# Patient Record
Sex: Female | Born: 1945
Health system: Southern US, Community
[De-identification: ages and names within clinical notes are randomized; demographics above are authoritative.]

## PROBLEM LIST (undated history)

## (undated) DIAGNOSIS — G20A1 Parkinson's disease without dyskinesia, without mention of fluctuations: Secondary | ICD-10-CM

## (undated) DIAGNOSIS — R112 Nausea with vomiting, unspecified: Secondary | ICD-10-CM

## (undated) DIAGNOSIS — F329 Major depressive disorder, single episode, unspecified: Secondary | ICD-10-CM

## (undated) DIAGNOSIS — Z9889 Other specified postprocedural states: Secondary | ICD-10-CM

## (undated) DIAGNOSIS — R0600 Dyspnea, unspecified: Secondary | ICD-10-CM

## (undated) DIAGNOSIS — E119 Type 2 diabetes mellitus without complications: Secondary | ICD-10-CM

## (undated) DIAGNOSIS — F32A Depression, unspecified: Secondary | ICD-10-CM

## (undated) DIAGNOSIS — K760 Fatty (change of) liver, not elsewhere classified: Secondary | ICD-10-CM

## (undated) DIAGNOSIS — E7800 Pure hypercholesterolemia, unspecified: Secondary | ICD-10-CM

## (undated) DIAGNOSIS — T8859XA Other complications of anesthesia, initial encounter: Secondary | ICD-10-CM

## (undated) DIAGNOSIS — C801 Malignant (primary) neoplasm, unspecified: Secondary | ICD-10-CM

## (undated) DIAGNOSIS — I10 Essential (primary) hypertension: Secondary | ICD-10-CM

## (undated) HISTORY — PX: COLONOSCOPY: SHX174

## (undated) HISTORY — PX: CATARACT EXTRACTION, BILATERAL: SHX1313

---

## 1998-05-01 ENCOUNTER — Other Ambulatory Visit: Admission: RE | Admit: 1998-05-01 | Discharge: 1998-05-01 | Payer: Self-pay

## 1998-05-07 ENCOUNTER — Encounter: Admission: RE | Admit: 1998-05-07 | Discharge: 1998-08-05 | Payer: Self-pay

## 1998-05-31 ENCOUNTER — Other Ambulatory Visit: Admission: RE | Admit: 1998-05-31 | Discharge: 1998-05-31 | Payer: Self-pay

## 1999-08-20 ENCOUNTER — Other Ambulatory Visit: Admission: RE | Admit: 1999-08-20 | Discharge: 1999-08-20 | Payer: Self-pay | Admitting: Family Medicine

## 2005-06-08 ENCOUNTER — Encounter: Admission: RE | Admit: 2005-06-08 | Discharge: 2005-06-08 | Payer: Self-pay | Admitting: Family Medicine

## 2006-02-10 ENCOUNTER — Other Ambulatory Visit: Admission: RE | Admit: 2006-02-10 | Discharge: 2006-02-10 | Payer: Self-pay | Admitting: Family Medicine

## 2006-04-20 ENCOUNTER — Encounter: Admission: RE | Admit: 2006-04-20 | Discharge: 2006-04-20 | Payer: Self-pay | Admitting: Gastroenterology

## 2008-10-17 ENCOUNTER — Other Ambulatory Visit: Admission: RE | Admit: 2008-10-17 | Discharge: 2008-10-17 | Payer: Self-pay | Admitting: Family Medicine

## 2010-01-15 ENCOUNTER — Other Ambulatory Visit: Admission: RE | Admit: 2010-01-15 | Discharge: 2010-01-15 | Payer: Self-pay | Admitting: Family Medicine

## 2010-04-05 ENCOUNTER — Emergency Department (HOSPITAL_COMMUNITY): Admission: EM | Admit: 2010-04-05 | Discharge: 2010-04-06 | Payer: Self-pay | Admitting: Emergency Medicine

## 2010-04-10 ENCOUNTER — Emergency Department (HOSPITAL_COMMUNITY): Admission: EM | Admit: 2010-04-10 | Discharge: 2010-04-10 | Payer: Self-pay | Admitting: Emergency Medicine

## 2010-04-18 ENCOUNTER — Encounter: Admission: RE | Admit: 2010-04-18 | Discharge: 2010-04-18 | Payer: Self-pay | Admitting: Family Medicine

## 2011-01-02 ENCOUNTER — Ambulatory Visit
Admission: RE | Admit: 2011-01-02 | Discharge: 2011-01-02 | Payer: Self-pay | Source: Home / Self Care | Attending: Internal Medicine | Admitting: Internal Medicine

## 2011-01-07 ENCOUNTER — Encounter: Payer: Self-pay | Admitting: Internal Medicine

## 2011-01-22 NOTE — Miscellaneous (Signed)
Summary: Orders Update pft charges  Clinical Lists Changes  Orders: Added new Service order of Carbon Monoxide diffusing w/capacity (94720) - Signed Added new Service order of Lung Volumes (94240) - Signed Added new Service order of Spirometry (Pre & Post) (94060) - Signed 

## 2016-04-30 ENCOUNTER — Other Ambulatory Visit: Payer: Self-pay | Admitting: Family Medicine

## 2016-04-30 DIAGNOSIS — R748 Abnormal levels of other serum enzymes: Secondary | ICD-10-CM

## 2016-05-07 ENCOUNTER — Ambulatory Visit
Admission: RE | Admit: 2016-05-07 | Discharge: 2016-05-07 | Disposition: A | Discharge status: Home / Self Care | Payer: Medicare PPO | Source: Ambulatory Visit | Attending: Family Medicine | Admitting: Family Medicine

## 2016-05-07 DIAGNOSIS — R748 Abnormal levels of other serum enzymes: Secondary | ICD-10-CM

## 2018-07-12 ENCOUNTER — Emergency Department (HOSPITAL_BASED_OUTPATIENT_CLINIC_OR_DEPARTMENT_OTHER)
Admission: EM | Admit: 2018-07-12 | Discharge: 2018-07-12 | Disposition: A | Discharge status: Home / Self Care | Payer: Medicare PPO | Attending: Emergency Medicine | Admitting: Emergency Medicine

## 2018-07-12 ENCOUNTER — Encounter (HOSPITAL_BASED_OUTPATIENT_CLINIC_OR_DEPARTMENT_OTHER): Payer: Self-pay

## 2018-07-12 ENCOUNTER — Emergency Department (HOSPITAL_BASED_OUTPATIENT_CLINIC_OR_DEPARTMENT_OTHER): Payer: Medicare PPO

## 2018-07-12 ENCOUNTER — Other Ambulatory Visit: Payer: Self-pay

## 2018-07-12 DIAGNOSIS — R0789 Other chest pain: Secondary | ICD-10-CM | POA: Insufficient documentation

## 2018-07-12 DIAGNOSIS — R0600 Dyspnea, unspecified: Secondary | ICD-10-CM | POA: Diagnosis not present

## 2018-07-12 DIAGNOSIS — S161XXA Strain of muscle, fascia and tendon at neck level, initial encounter: Secondary | ICD-10-CM | POA: Insufficient documentation

## 2018-07-12 DIAGNOSIS — Z87891 Personal history of nicotine dependence: Secondary | ICD-10-CM | POA: Insufficient documentation

## 2018-07-12 DIAGNOSIS — I10 Essential (primary) hypertension: Secondary | ICD-10-CM | POA: Diagnosis not present

## 2018-07-12 DIAGNOSIS — Y9389 Activity, other specified: Secondary | ICD-10-CM | POA: Diagnosis not present

## 2018-07-12 DIAGNOSIS — Y998 Other external cause status: Secondary | ICD-10-CM | POA: Diagnosis not present

## 2018-07-12 DIAGNOSIS — S39012A Strain of muscle, fascia and tendon of lower back, initial encounter: Secondary | ICD-10-CM | POA: Diagnosis not present

## 2018-07-12 DIAGNOSIS — S0990XA Unspecified injury of head, initial encounter: Secondary | ICD-10-CM | POA: Diagnosis not present

## 2018-07-12 DIAGNOSIS — S22060A Wedge compression fracture of T7-T8 vertebra, initial encounter for closed fracture: Secondary | ICD-10-CM | POA: Insufficient documentation

## 2018-07-12 DIAGNOSIS — S3992XA Unspecified injury of lower back, initial encounter: Secondary | ICD-10-CM | POA: Diagnosis present

## 2018-07-12 DIAGNOSIS — W19XXXA Unspecified fall, initial encounter: Secondary | ICD-10-CM

## 2018-07-12 DIAGNOSIS — Y92018 Other place in single-family (private) house as the place of occurrence of the external cause: Secondary | ICD-10-CM | POA: Insufficient documentation

## 2018-07-12 DIAGNOSIS — E119 Type 2 diabetes mellitus without complications: Secondary | ICD-10-CM | POA: Diagnosis not present

## 2018-07-12 DIAGNOSIS — W01198A Fall on same level from slipping, tripping and stumbling with subsequent striking against other object, initial encounter: Secondary | ICD-10-CM | POA: Diagnosis not present

## 2018-07-12 HISTORY — DX: Depression, unspecified: F32.A

## 2018-07-12 HISTORY — DX: Essential (primary) hypertension: I10

## 2018-07-12 HISTORY — DX: Type 2 diabetes mellitus without complications: E11.9

## 2018-07-12 HISTORY — DX: Pure hypercholesterolemia, unspecified: E78.00

## 2018-07-12 HISTORY — DX: Major depressive disorder, single episode, unspecified: F32.9

## 2018-07-12 MED ORDER — TRAMADOL HCL 50 MG PO TABS: 50.0000 mg | ORAL_TABLET | Freq: Four times a day (QID) | ORAL | 0 refills | Status: DC | PRN

## 2018-07-12 MED ORDER — TRAMADOL HCL 50 MG PO TABS
50.0000 mg | ORAL_TABLET | Freq: Once | ORAL | Status: AC
  Administered 2018-07-12: 50 mg via ORAL
  Filled 2018-07-12: qty 1

## 2018-07-12 MED ORDER — DOCUSATE SODIUM 100 MG PO CAPS: 100.0000 mg | ORAL_CAPSULE | Freq: Every day | ORAL | 0 refills | Status: DC

## 2018-07-12 NOTE — ED Triage Notes (Signed)
Pt states she slipped/fell in water approx 2pm-pain to mid back-to triage in w/c

## 2018-07-12 NOTE — ED Provider Notes (Signed)
Emergency Department Provider Note   I have reviewed the triage vital signs and the nursing notes.   HISTORY  Chief Complaint Fall   HPI Briana [name] is a 72 y.o. female with PMH of DM, HLD, HTN presents to the emergency department for evaluation after mechanical fall.  The patient's washing machine flooded and she had water over the concrete floor.  She was trying to clean it up when she slipped and fell backwards landing onto her back.  She is describing severe pain in the middle of her back primarily but is also having discomfort in the lower back, neck, posterior scalp.  She has some bruising over the left wrist but denies pain in this area.  She states she thinks to try to grab onto something as she was falling.  She does not believe she lost consciousness during the fall.  She initially had some trouble breathing but that has improved.   Past Medical History:  Diagnosis Date  . Depression   . Diabetes mellitus without complication ([place])   . High cholesterol   . Hypertension     There are no active problems to display for this patient.   History reviewed. No pertinent surgical history.    Allergies Patient has no known allergies.  No family history on file.  Social History Social History   Tobacco Use  . Smoking status: Former Research scientist (life sciences)  . Smokeless tobacco: Never Used  Substance Use Topics  . Alcohol use: Never    Frequency: Never  . Drug use: Never    Review of Systems  Constitutional: No fever/chills Eyes: No visual changes. ENT: No sore throat. Cardiovascular: Positive chest wall pain.  Respiratory: Positive shortness of breath. Gastrointestinal: No abdominal pain.  No nausea, no vomiting.  No diarrhea.  No constipation. Genitourinary: Negative for dysuria. Musculoskeletal: Positive for back pain. Skin: Negative for rash. Neurological: Negative for headaches, focal weakness or numbness.  10-point ROS otherwise  negative.  ____________________________________________   PHYSICAL EXAM:  VITAL SIGNS: ED Triage Vitals  Enc Vitals Group     BP 07/12/18 1908 (!) 130/103     Pulse Rate 07/12/18 1908 86     Resp 07/12/18 1908 20     Temp 07/12/18 1908 97.9 F (36.6 C)     Temp Source 07/12/18 1908 Oral     SpO2 07/12/18 1908 100 %     Weight 07/12/18 1907 173 lb (78.5 kg)     Height 07/12/18 1907 5' (1.524 m)     Pain Score 07/12/18 1904 5   Constitutional: Alert and oriented. Well appearing and in no acute distress. Eyes: Conjunctivae are normal. PERRL. Head: Atraumatic. Nose: No congestion/rhinnorhea. Mouth/Throat: Mucous membranes are moist.  Oropharynx non-erythematous. Neck: No stridor. Positive midline and paraspinal tenderness to palpation.  Cardiovascular: Normal rate, regular rhythm. Good peripheral circulation. Grossly normal heart sounds.   Respiratory: Normal respiratory effort.  No retractions. Lungs CTAB. Gastrointestinal: Soft and nontender. No distention.  Musculoskeletal: No lower extremity tenderness nor edema. No gross deformities of extremities. Tenderness over the thoracic and lumbar spine both in the midline and paraspinal areas.  Neurologic:  Normal speech and language. No gross focal neurologic deficits are appreciated.  Skin:  Skin is warm, dry and intact. Mild bruising over the left wrist.   ____________________________________________  RADIOLOGY  Dg Chest 2 View  Result Date: 07/12/2018 CLINICAL DATA:  Recent fall with chest pain, initial encounter EXAM: CHEST - 2 VIEW COMPARISON:  04/05/2010 FINDINGS: Cardiac shadow  is at the upper limits of normal in size but stable. The lungs are clear bilaterally. Degenerative changes about shoulder joints are noted. No acute bony abnormality is seen. Changes consistent with compression deformities are noted at T6, T8 and T12. The T8 fracture is felt to represent an acute change given recent CT. IMPRESSION: Acute T8 compression  fracture.  No other focal abnormality is noted. Electronically Signed   By: Inez Catalina M.D.   On: 07/12/2018 20:21   Dg Lumbar Spine Complete  Result Date: 07/12/2018 CLINICAL DATA:  Recent fall with low back pain, initial encounter EXAM: LUMBAR SPINE - COMPLETE 4+ VIEW COMPARISON:  None. FINDINGS: Five lumbar type vertebral bodies are well visualized. Vertebral body height is well maintained. Chronic T12 compression deformity is noted. Osteophytic changes are noted. Facet hypertrophic changes are noted with mild anterolisthesis of L5 on S1. Aortic calcifications are seen. No pars defects are noted. Multilevel disc space narrowing is seen. IMPRESSION: Degenerative change without acute abnormality. Electronically Signed   By: Inez Catalina M.D.   On: 07/12/2018 20:24   Dg Pelvis 1-2 Views  Result Date: 07/12/2018 CLINICAL DATA:  Pelvic pain following fall, initial encounter EXAM: PELVIS - 1-2 VIEW COMPARISON:  None. FINDINGS: Pelvic ring is intact. Degenerative changes of the hip joints are noted. No acute fracture or dislocation is seen. No soft tissue changes are noted. IMPRESSION: No acute abnormality noted. Electronically Signed   By: Inez Catalina M.D.   On: 07/12/2018 20:22   Ct Head Wo Contrast  Result Date: 07/12/2018 CLINICAL DATA:  Head injury after fall today. EXAM: CT HEAD WITHOUT CONTRAST CT CERVICAL SPINE WITHOUT CONTRAST TECHNIQUE: Multidetector CT imaging of the head and cervical spine was performed following the standard protocol without intravenous contrast. Multiplanar CT image reconstructions of the cervical spine were also generated. COMPARISON:  CT scan of April 05, 2010.  MRI of April 18, 2010. FINDINGS: CT HEAD FINDINGS Brain: No evidence of acute infarction, hemorrhage, hydrocephalus, extra-axial collection or mass lesion/mass effect. Vascular: No hyperdense vessel or unexpected calcification. Skull: Normal. Negative for fracture or focal lesion. Sinuses/Orbits: No acute finding.  Other: None. CT CERVICAL SPINE FINDINGS Alignment: Normal. Skull base and vertebrae: No acute fracture. No primary bone lesion or focal pathologic process. Soft tissues and spinal canal: No prevertebral fluid or swelling. No visible canal hematoma. Disc levels: Moderate degenerative disc disease is noted at C3-4, C4-5, C5-6 and C6-7 with anterior osteophyte formation. Upper chest: Negative. Other: None. IMPRESSION: Normal head CT. Multilevel degenerative disc disease. No acute abnormality seen in the cervical spine. Electronically Signed   By: Marijo Conception, M.D.   On: 07/12/2018 20:05   Ct Cervical Spine Wo Contrast  Result Date: 07/12/2018 CLINICAL DATA:  Head injury after fall today. EXAM: CT HEAD WITHOUT CONTRAST CT CERVICAL SPINE WITHOUT CONTRAST TECHNIQUE: Multidetector CT imaging of the head and cervical spine was performed following the standard protocol without intravenous contrast. Multiplanar CT image reconstructions of the cervical spine were also generated. COMPARISON:  CT scan of April 05, 2010.  MRI of April 18, 2010. FINDINGS: CT HEAD FINDINGS Brain: No evidence of acute infarction, hemorrhage, hydrocephalus, extra-axial collection or mass lesion/mass effect. Vascular: No hyperdense vessel or unexpected calcification. Skull: Normal. Negative for fracture or focal lesion. Sinuses/Orbits: No acute finding. Other: None. CT CERVICAL SPINE FINDINGS Alignment: Normal. Skull base and vertebrae: No acute fracture. No primary bone lesion or focal pathologic process. Soft tissues and spinal canal: No prevertebral fluid or swelling.  No visible canal hematoma. Disc levels: Moderate degenerative disc disease is noted at C3-4, C4-5, C5-6 and C6-7 with anterior osteophyte formation. Upper chest: Negative. Other: None. IMPRESSION: Normal head CT. Multilevel degenerative disc disease. No acute abnormality seen in the cervical spine. Electronically Signed   By: Marijo Conception, M.D.   On: 07/12/2018 20:05   Ct  Thoracic Spine Wo Contrast  Result Date: 07/12/2018 CLINICAL DATA:  Fall with large hematoma to the mid back between the T and L-spine EXAM: CT THORACIC SPINE WITHOUT CONTRAST TECHNIQUE: Multidetector CT images of the thoracic were obtained using the standard protocol without intravenous contrast. COMPARISON:  None. FINDINGS: Alignment: Sagittal alignment is within normal limits. Vertebrae: Mild age indeterminate compression deformity at T6, less than 20% loss of height anteriorly. Suspected acute minimal superior endplate deformity at T8. Less than 10% loss of the vertebral body height anteriorly. Mild age indeterminate compression deformity at T12, less than 20% loss of height anteriorly. Schmorl's node superior endplate T11 with age indeterminate mild superior endplate deformity. No bony retropulsion into the spinal canal. Paraspinal and other soft tissues: No paravertebral or paraspinal soft tissue abnormality. Disc levels: Vacuum discs at T10-T11 and T11-T12. IMPRESSION: 1. Suspected acute minimal superior endplate deformity at T8. 2. Age indeterminate mild anterior compression deformities at T6 and T12. Age indeterminate mild superior endplate deformity at X83. No bony canal compromise. Electronically Signed   By: Donavan Foil M.D.   On: 07/12/2018 20:17    ____________________________________________   PROCEDURES  Procedure(s) performed:   Procedures  None ____________________________________________   INITIAL IMPRESSION / ASSESSMENT AND PLAN / ED COURSE  Pertinent labs & imaging results that were available during my care of the patient were reviewed by me and considered in my medical decision making (see chart for details).  Patient with pain in her back and chest wall after falling.  No evidence of head trauma on my exam but she is complaining of posterior head pain.  No lacerations.  Patient is not anticoagulated.  Plan for CT imaging of the head, neck, thoracic spine.  Will obtain  plain films of the chest and pelvis.  She has some bruising over the left wrist but no tenderness to palpation and full range of motion without pain.  T8 compression fracture noted. No neuro deficits. Patient given pain medication in the ED and is ambulatory without significant difficulty. Offered obs admit for pain control and PT but patient is feeling well enough to go home. Ordered home health and discussed return precautions in detail.   At this time, I do not feel there is any life-threatening condition present. I have reviewed and discussed all results (EKG, imaging, lab, urine as appropriate), exam findings with patient. I have reviewed nursing notes and appropriate previous records.  I feel the patient is safe to be discharged home without further emergent workup. Discussed usual and customary return precautions. Patient and family (if present) verbalize understanding and are comfortable with this plan.  Patient will follow-up with their primary care provider. If they do not have a primary care provider, information for follow-up has been provided to them. All questions have been answered.  ____________________________________________  FINAL CLINICAL IMPRESSION(S) / ED DIAGNOSES  Final diagnoses:  Compression fracture of T8 vertebra ([place])  Fall, initial encounter  Injury of head, initial encounter  Chest wall pain  Strain of neck muscle, initial encounter  Strain of lumbar region, initial encounter     MEDICATIONS GIVEN DURING THIS VISIT:  Medications  traMADol (ULTRAM) tablet 50 mg (50 mg Oral Given 07/12/18 1927)     NEW OUTPATIENT MEDICATIONS STARTED DURING THIS VISIT:  Discharge Medication List as of 07/12/2018  9:08 PM    START taking these medications   Details  docusate sodium (COLACE) 100 MG capsule Take 1 capsule (100 mg total) by mouth daily., Starting Tue 07/12/2018, Print    traMADol (ULTRAM) 50 MG tablet Take 1 tablet (50 mg total) by mouth every 6 (six) hours as  needed., Starting Tue 07/12/2018, Print        Note:  This document was prepared using Dragon voice recognition software and may include unintentional dictation errors.  Nanda Quinton, MD Emergency Medicine    Tyce Delcid, [facility], MD 07/13/18 [id number]

## 2018-07-12 NOTE — ED Notes (Signed)
Patient transported to CT 

## 2018-07-12 NOTE — Discharge Instructions (Signed)
You were seen in the ED today after a fall with spine compression fracture. This will likely heal well on its own but you will need to see the PCP and possibly the Neurosurgeon if symptoms worsen. I am asking the case manager to call in the AM to discuss home PT. Return to the ED with any new or worsening symptoms.

## 2018-07-12 NOTE — ED Notes (Signed)
Patient transported to X-ray 

## 2018-07-12 NOTE — ED Notes (Signed)
Patient ambulated in hall to the bathroom and back, patient has slow but steady gait, needed assistance sitting up to get out of bed but was able to use restroom on her own. EDP made aware.

## 2018-07-13 NOTE — Care Management Note (Signed)
Case Management Note  CM consulted for [facility] PT services.  CM called and spoke with pt who declined [facility] at this time reporting she is feeling better today.  She is ambulatory and can get in and out of bed, with some pain, but she reports she expects that for several days.  CM discussed that if she changed her mind or if things got worse instead of better to contact her PCP for outpt PT or [facility].  Updated Dr. Laverta Baltimore via messages.  No further CM needs noted at this time.  Ercil Cassis, Benjaman Lobe, RN 07/13/2018, 9:29 AM

## 2018-08-11 ENCOUNTER — Other Ambulatory Visit: Payer: Self-pay | Admitting: Family Medicine

## 2018-08-11 ENCOUNTER — Ambulatory Visit
Admission: RE | Admit: 2018-08-11 | Discharge: 2018-08-11 | Disposition: A | Discharge status: Home / Self Care | Payer: Medicare PPO | Source: Ambulatory Visit | Attending: Family Medicine | Admitting: Family Medicine

## 2018-08-11 DIAGNOSIS — S22000A Wedge compression fracture of unspecified thoracic vertebra, initial encounter for closed fracture: Secondary | ICD-10-CM

## 2018-12-19 IMAGING — DX DG THORACIC SPINE 3V
4 series · 4 of 4 positions shown · non-contrast
Comparison: 07/12/2018

CLINICAL DATA: Compression fracture, follow-up

EXAM:
THORACIC SPINE - 3 VIEWS

[dg thoracic spine w/swimmers (1 of 4)]
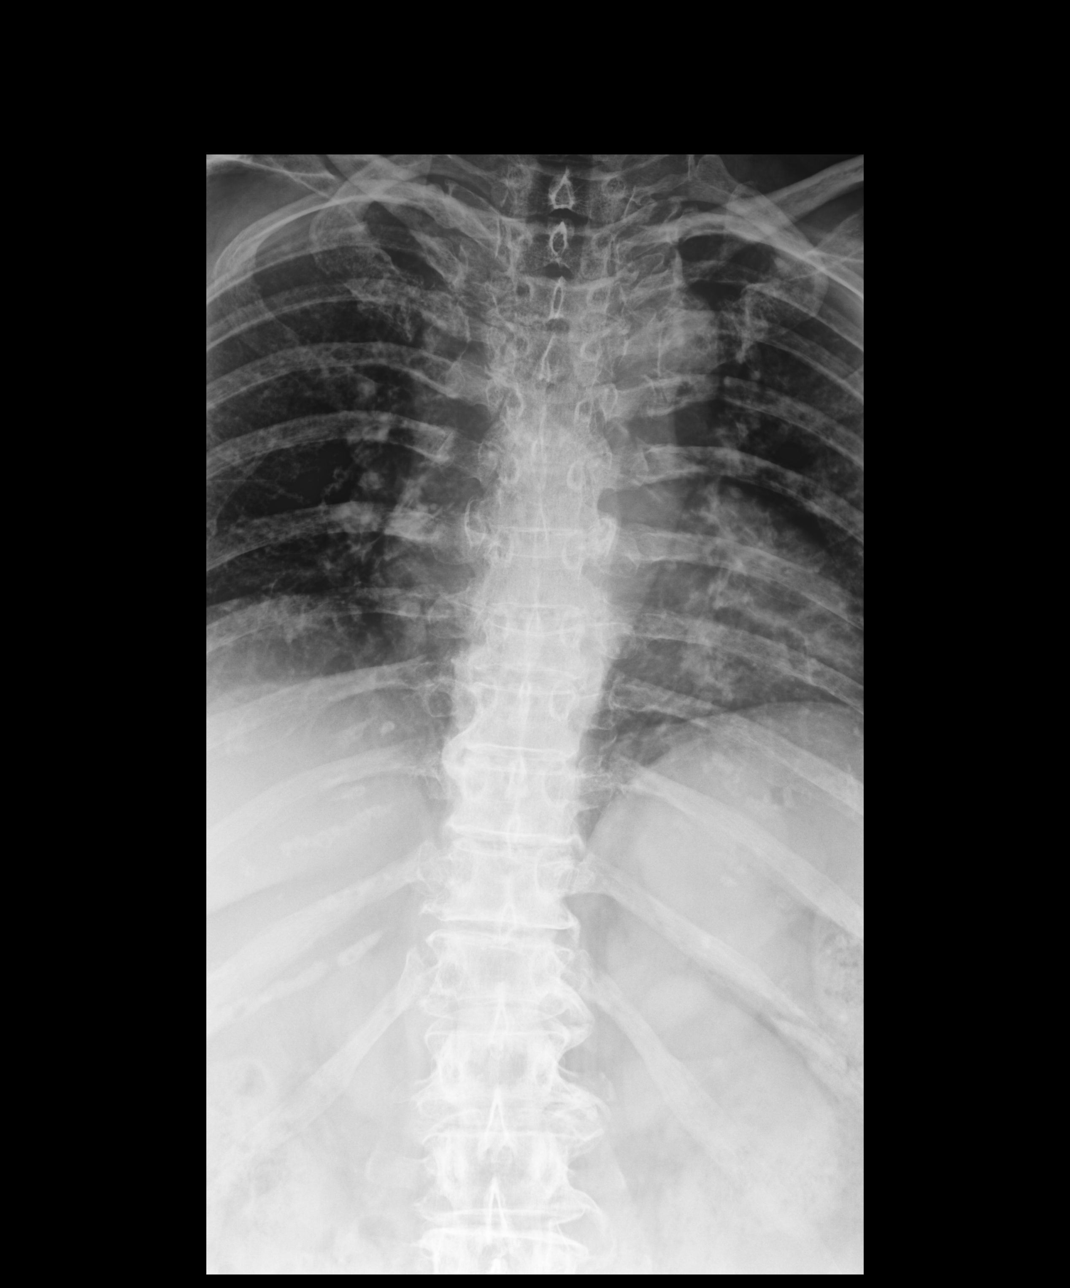

[dg thoracic spine w/swimmers (2 of 4)]
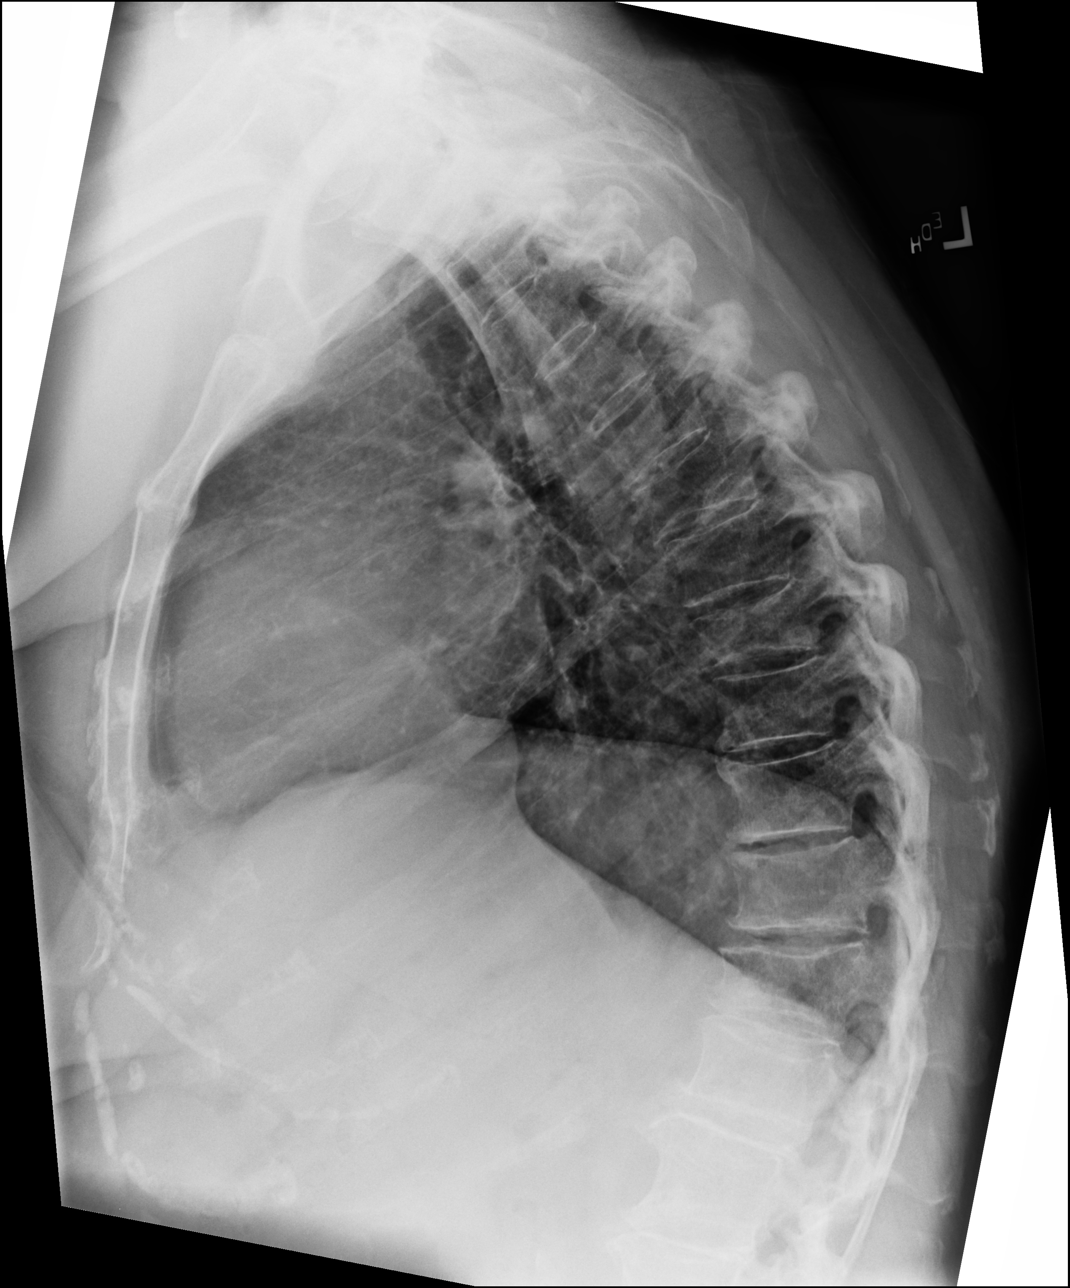

[dg thoracic spine w/swimmers (3 of 4)]
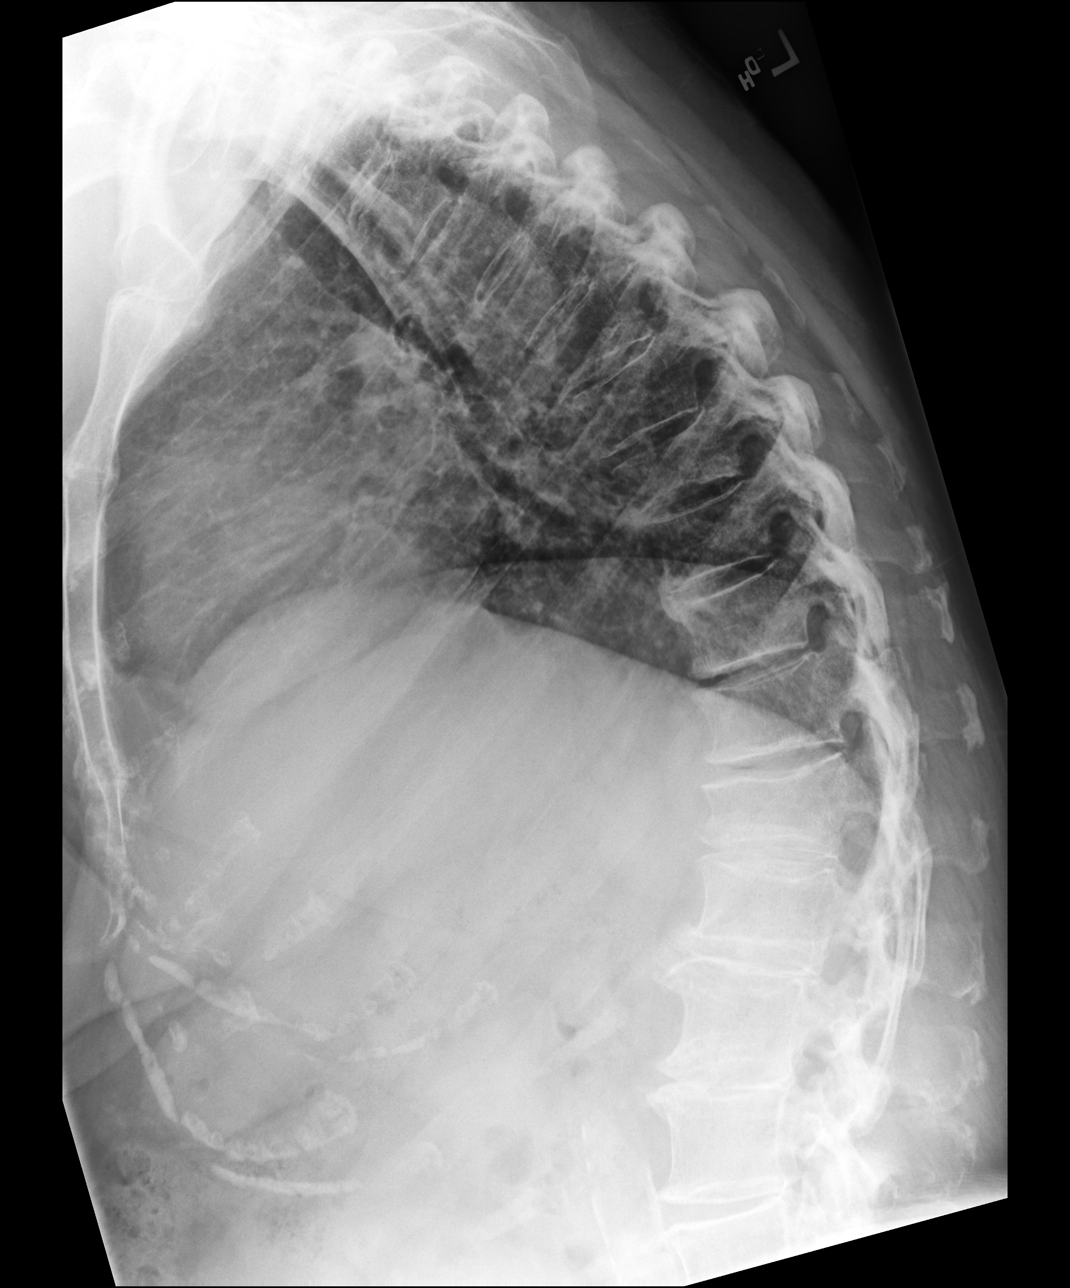

[dg thoracic spine w/swimmers (4 of 4)]
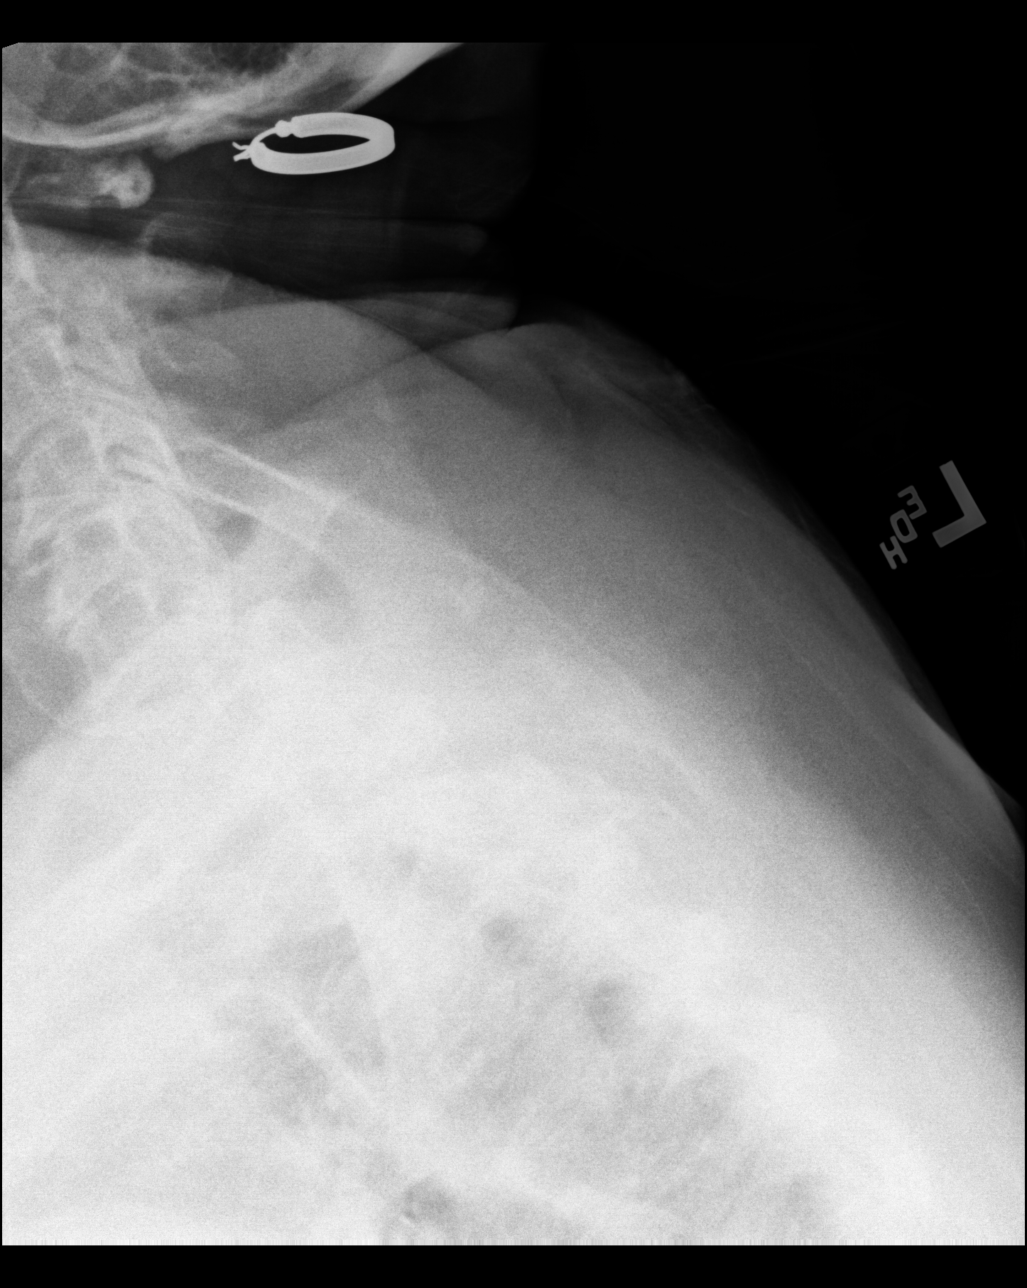

[4 of 4 positions shown; findings below may reference images not displayed]

FINDINGS: Multiple compression fractures in the mid and lower thoracic spine,
noted at T6, T8, and T12. These are stable when compared to prior
study. No malalignment. Degenerative spurring anteriorly in the mid
and lower thoracic spine.
IMPRESSION: Stable mild compression fractures at T6, T8 and T12. No acute bony
abnormality.

## 2021-01-13 DIAGNOSIS — Z85828 Personal history of other malignant neoplasm of skin: Secondary | ICD-10-CM | POA: Diagnosis not present

## 2021-01-13 DIAGNOSIS — D1801 Hemangioma of skin and subcutaneous tissue: Secondary | ICD-10-CM | POA: Diagnosis not present

## 2021-01-13 DIAGNOSIS — L821 Other seborrheic keratosis: Secondary | ICD-10-CM | POA: Diagnosis not present

## 2021-01-13 DIAGNOSIS — C44519 Basal cell carcinoma of skin of other part of trunk: Secondary | ICD-10-CM | POA: Diagnosis not present

## 2021-03-21 DIAGNOSIS — J45909 Unspecified asthma, uncomplicated: Secondary | ICD-10-CM | POA: Diagnosis not present

## 2021-03-21 DIAGNOSIS — I5032 Chronic diastolic (congestive) heart failure: Secondary | ICD-10-CM | POA: Diagnosis not present

## 2021-03-21 DIAGNOSIS — E7800 Pure hypercholesterolemia, unspecified: Secondary | ICD-10-CM | POA: Diagnosis not present

## 2021-03-21 DIAGNOSIS — E1121 Type 2 diabetes mellitus with diabetic nephropathy: Secondary | ICD-10-CM | POA: Diagnosis not present

## 2021-03-21 DIAGNOSIS — K219 Gastro-esophageal reflux disease without esophagitis: Secondary | ICD-10-CM | POA: Diagnosis not present

## 2021-03-21 DIAGNOSIS — N182 Chronic kidney disease, stage 2 (mild): Secondary | ICD-10-CM | POA: Diagnosis not present

## 2021-03-21 DIAGNOSIS — I129 Hypertensive chronic kidney disease with stage 1 through stage 4 chronic kidney disease, or unspecified chronic kidney disease: Secondary | ICD-10-CM | POA: Diagnosis not present

## 2021-04-07 DIAGNOSIS — E119 Type 2 diabetes mellitus without complications: Secondary | ICD-10-CM | POA: Diagnosis not present

## 2021-04-09 ENCOUNTER — Encounter: Payer: Self-pay | Admitting: Pulmonary Disease

## 2021-04-09 ENCOUNTER — Ambulatory Visit (INDEPENDENT_AMBULATORY_CARE_PROVIDER_SITE_OTHER): Payer: Medicare Other | Admitting: Pulmonary Disease

## 2021-04-09 ENCOUNTER — Other Ambulatory Visit: Payer: Self-pay

## 2021-04-09 ENCOUNTER — Ambulatory Visit (INDEPENDENT_AMBULATORY_CARE_PROVIDER_SITE_OTHER): Payer: Medicare Other

## 2021-04-09 VITALS — BP 138/80 | HR 100 | Temp 98.3°F | Ht 66.0 in | Wt 177.0 lb

## 2021-04-09 DIAGNOSIS — R0602 Shortness of breath: Secondary | ICD-10-CM | POA: Diagnosis not present

## 2021-04-09 DIAGNOSIS — R0600 Dyspnea, unspecified: Secondary | ICD-10-CM | POA: Diagnosis not present

## 2021-04-09 DIAGNOSIS — R0689 Other abnormalities of breathing: Secondary | ICD-10-CM

## 2021-04-09 DIAGNOSIS — G252 Other specified forms of tremor: Secondary | ICD-10-CM | POA: Diagnosis not present

## 2021-04-09 DIAGNOSIS — R0609 Other forms of dyspnea: Secondary | ICD-10-CM

## 2021-04-09 DIAGNOSIS — G20 Parkinson's disease: Secondary | ICD-10-CM | POA: Insufficient documentation

## 2021-04-09 NOTE — Assessment & Plan Note (Signed)
Although she does not complain of extremity weakness, she has brisk reflexes and tremors in the right arm .  CT of cervical spine has shown degenerative disease and compression fractures and T-spine.  She does not have any bowel bladder symptoms.  I will refer her to neurology for further evaluation

## 2021-04-09 NOTE — Patient Instructions (Signed)
CXR today  Ambulatory sat  Schedule PFTs Obtain echo report from PCP Schedule Neurology consultation for tremors & brisk reflexes

## 2021-04-09 NOTE — Progress Notes (Signed)
Subjective:    Patient ID: Briana [name], female    DOB: [birth year], 75 y.o.   MRN: [id number]  HPI  75 year old remote social smoker presents for evaluation of shortness of breath and wheezing. She smoked less than 10 pack years before she quit around 2000 She reports a diagnosis of asthma by her PCP about 4 years ago.  She reports increased dyspnea on exertion for the past year and now reports NYHA class III symptoms on routine activities such as climbing stairs or making her bed .  She has been prescribed Symbicort and albuterol for rescue but reports limited benefit from this. Her husband of 54 years died a year and a half ago and she has only paid more attention to her symptoms after his death. Chest x-ray July 14, 2018 is noted to be clear. We obtained another chest x-ray today which shows low lung volumes and no infiltrates or effusions. And Echo cardiogram was apparently performed by PCP and she was told this was normal. Ambulatory saturation -heart rate increased from 98-1 1 7  and oxygen saturation dropped minimally from 97 to 95% on room air, stopped after 2 labs due to increased dyspnea and took 2 minutes to return to baseline  She denies seasonal allergies.  She reports intermittent wheezing especially in her upper airway symptoms even when talking to people    Significant tests/ events reviewed  PFTs 12/2010 near nml CT T-spine 2018/07/14 >> compression deformities at T6, T11and T12 acute T8 fracure Degenerative disc disease of C-spine noted  Past Medical History:  Diagnosis Date  . Depression   . Diabetes mellitus without complication ([place])   . High cholesterol   . Hypertension     History reviewed. No pertinent surgical history.  No Known Allergies   Social History   Socioeconomic History  . Marital status: Married    Spouse name: Not on file  . Number of children: Not on file  . Years of education: Not on file  . Highest education level: Not on file  Occupational  History  . Not on file  Tobacco Use  . Smoking status: Former Research scientist (life sciences)  . Smokeless tobacco: Never Used  Substance and Sexual Activity  . Alcohol use: Never  . Drug use: Never  . Sexual activity: Not on file  Other Topics Concern  . Not on file  Social History Narrative  . Not on file   Social Determinants of Health   Financial Resource Strain: Not on file  Food Insecurity: Not on file  Transportation Needs: Not on file  Physical Activity: Not on file  Stress: Not on file  Social Connections: Not on file  Intimate Partner Violence: Not on file    FH - no h/o asthma , lung cancer, PE   Review of Systems RUE tremors +  Constitutional: negative for anorexia, fevers and sweats  Eyes: negative for irritation, redness and visual disturbance  Ears, nose, mouth, throat, and face: negative for earaches, epistaxis, nasal congestion and sore throat  Respiratory: negative for cough,  sputum and wheezing  Cardiovascular: negative for chest pain,  lower extremity edema, orthopnea, palpitations and syncope  Gastrointestinal: negative for abdominal pain, constipation, diarrhea, melena, nausea and vomiting  Genitourinary:negative for dysuria, frequency and hematuria  Hematologic/lymphatic: negative for bleeding, easy bruising and lymphadenopathy  Musculoskeletal:negative for arthralgias, muscle weakness and stiff joints  Neurological: negative for coordination problems, gait problems, headaches and weakness  Endocrine: negative for diabetic symptoms including polydipsia, polyuria and weight loss  Objective:   Physical Exam  Gen. Pleasant, obese, in no distress, normal affect ENT - no pallor,icterus, no post nasal drip, class 2-3 airway Neck: No JVD, no thyromegaly, no carotid bruits Lungs: no use of accessory muscles, no dullness to percussion, decreased without rales or rhonchi  Cardiovascular: Rhythm regular, heart sounds  normal, no murmurs or gallops, no peripheral  edema Abdomen: soft and non-tender, no hepatosplenomegaly, BS normal. Musculoskeletal: No deformities, no cyanosis or clubbing Neuro:  alert, non focal,  Tremors ++ RUE , brisk reflexes ++ all 4 Es       Assessment & Plan:

## 2021-04-09 NOTE — Assessment & Plan Note (Signed)
This has been treated as asthma in the past with Symbicort and albuterol but seems to be atypical presentation.  There is no evidence of ILD she does have some upper airway pseudo wheezing today. We will obtain PFTs to clarify.  There is no reason to suspect tracheal stenosis, she has never had surgeries or intubations. Chest x-ray today shows low lung volumes with elevated diaphragms and will obtain sniff test to check for diaphragmatic weakness.  She may need a sleep study to qualify for CPAP  I would also like to review her cardiac evaluation including echocardiogram to ensure that she does not have pulm hypertension.  She does not have any signs of overt heart failure today or pedal Edema

## 2021-04-10 ENCOUNTER — Encounter: Payer: Self-pay | Admitting: Neurology

## 2021-04-14 ENCOUNTER — Other Ambulatory Visit: Payer: Self-pay

## 2021-04-14 ENCOUNTER — Ambulatory Visit (HOSPITAL_COMMUNITY)
Admission: RE | Admit: 2021-04-14 | Discharge: 2021-04-14 | Disposition: A | Discharge status: Home / Self Care | Payer: Medicare Other | Source: Ambulatory Visit | Attending: Pulmonary Disease | Admitting: Pulmonary Disease

## 2021-04-14 DIAGNOSIS — R0600 Dyspnea, unspecified: Secondary | ICD-10-CM | POA: Insufficient documentation

## 2021-04-14 DIAGNOSIS — R0689 Other abnormalities of breathing: Secondary | ICD-10-CM | POA: Insufficient documentation

## 2021-04-14 NOTE — Progress Notes (Signed)
Assessment/Plan:    1.  Hyperreflexia  -Patient significantly hyperreflexic, with "hung up" reflexes throughout.  Given multiple falls, we will go ahead and do MRI of the cervical spine and brain.  She has known history of thoracic fracture from falls, but this would not account for the hyperreflexia in the arms.  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  2.  Parkinsonism.  I suspect that this does represent idiopathic Parkinson's disease.  The patient has tremor, bradykinesia, rigidity and mild postural instability.  -We discussed the diagnosis as well as pathophysiology of the disease.  We discussed treatment options as well as prognostic indicators.  Patient education was provided.  -Greater than 50% of the 60 minute visit was spent in counseling answering questions and talking about what to expect now as well as in the future.  We talked about medication options as well as potential future surgical options.  We talked about safety in the home.  -We decided to add carbidopa/levodopa 25/100.  1/2 tab tid x 1 wk, then 1/2 in am & noon & 1 at night for a week, then 1/2 in am &1 at noon &night for a week, then 1 po tid at 9 AM/1 PM/5 PM.  Risks, benefits, side effects and alternative therapies were discussed.  The opportunity to ask questions was given and they were answered to the best of my ability.  The patient expressed understanding and willingness to follow the outlined treatment protocols.  -I will refer the patient to the [facility]'s program [facility], for PT aqua therapy.  I do not think she is going to tolerate other therapies very well because of lung function.  -We discussed community resources in the area including patient support groups and community exercise programs for PD and pt education was provided to the patient.  She met with my clinical social worker today.  3.  Tremor  -While patient certainly does have parkinsonian tremor on the right, she  also has some postural tremor bilaterally.  I think that her albuterol and even Symbicort could certainly contribute to this.  This is a very minor component of her tremor.     Subjective:   Briana [name] was seen today in the movement disorders clinic for neurologic consultation at the request of Rigoberto Noel, MD.  The consultation is for the evaluation of tremors (RUE) and hyperreflexia.  Outside records that were made available to me were reviewed.   Specific Symptoms:  Tremor: Yes.  , few months but she isn't sure how long.  Only R hand.  Notices it when at rest at church but son told her he notes it when she picks up something.     Fam hx of tremor?  No.  Affected by caffeine:  No.  Affected by alcohol:  Doesn't drink  Affected by stress:  No.  Affected by fatigue:  No., in fact seems like it is present more in the AM  Spills soup if on spoon:  No.  Spills glass of liquid if full:  No. but may carry with 2 hands  Affects ADL's (tying shoes, brushing teeth, etc):  No.   Tremor inducing med:  Albuterol - using at least one time per day; symbicort 2 times per day   Other sx's: Voice: no change - always been told she has had soft voice Sleep: sleeps well  Vivid Dreams:  Yes.    Acting out dreams: some sleep talking Wet Pillows:  No. Postural symptoms:  Yes.    She denies extremity weakness.    Falls?  Yes.  , last fall was 1 week ago (one prior to that was a month ago) - the one last week was outside - doing yard work and she tripped and fell. Bradykinesia symptoms: slow movements and difficulty getting out of a chair Loss of smell:  No. Loss of taste:  No. Urinary Incontinence:  No. , not in day, wears pad at night Difficulty Swallowing:  No. Handwriting, micrographia: No. but its sloppy Trouble with ADL's:  No.  Trouble buttoning clothing: No. Depression:  No., denies depression (some tearful when talks about this), admits to some anxiety Memory changes:   No. Hallucinations:  No.  visual distortions: No. N/V:  No. Lightheaded:  No.  Syncope: No. Diplopia:  No. Dyskinesia:  No. Prior exposure to reglan/antipsychotics: No.    last neuroimaging was in July, 2019 after a mechanical fall.  Patient's washing machine had actually flooded and she slipped on the water and fell backwards.  CT of the brain was negative.  I did attempt to review those films online, but the films were not available.  CT of the cervical spine demonstrated just degenerative changes.  CT of the thoracic spine demonstrated acute endplate deformity at T8 and indeterminate compression fracture at T6 and T12.  ALLERGIES:  No Known Allergies  CURRENT MEDICATIONS:  Current Outpatient Medications  Medication Instructions  . albuterol (VENTOLIN HFA) 108 (90 Base) MCG/ACT inhaler 1 puff, Inhalation, Every 6 hours PRN, Patient reports albuterol inhaler use, 1 puff as needed. She does not know the specific dose /previous written order details  . atorvastatin (LIPITOR) 40 mg, Oral, Daily  . budesonide-formoterol (SYMBICORT) 80-4.5 MCG/ACT inhaler 2 puffs, Inhalation, 2 times daily, Patient reports she does not know specific dose/orders; only able to state that she has been prescribed Symbicort and Albuterol inhalers in the past. Dr. Elsworth Soho aware; no new orders rcvd.  Marland Kitchen buPROPion (WELLBUTRIN XL) 150 mg, Oral, Daily  . calcium-vitamin D (OSCAL WITH D) 250-125 MG-UNIT tablet 1 tablet, Oral, Daily  . carbidopa-levodopa (SINEMET IR) 25-100 MG tablet 1 tablet, Oral, 3 times daily  . docusate sodium (COLACE) 100 mg, Oral, Daily  . pantoprazole (PROTONIX) 20 mg, Oral, Daily  . sertraline (ZOLOFT) 100 mg, Oral, Daily  . traMADol (ULTRAM) 50 mg, Oral, Every 6 hours PRN  . valsartan-hydrochlorothiazide (DIOVAN-HCT) 160-12.5 MG tablet 1 tablet, Oral, Daily    Objective:   VITALS:   Vitals:   04/15/21 1307  BP: 136/82  Pulse: (!) 113  SpO2: 97%  Weight: 178 lb (80.7 kg)  Height: 5'  (1.524 m)    GEN:  The patient appears stated age and is in NAD. HEENT:  Normocephalic, atraumatic.  The mucous membranes are moist. The superficial temporal arteries are without ropiness or tenderness. CV:  Tachy.  Regular.   Lungs: Clear to auscultation bilaterally, although upper airway noises are audibly heard but not in the stethoscope.  She has DOE.   Neck/HEME:  There are no carotid bruits bilaterally.  Neurological examination:  Orientation: The patient is alert and oriented x3.  Cranial nerves: There is good facial symmetry. Extraocular muscles are intact. The visual fields are full to confrontational testing. The speech is fluent and clear. Soft palate rises symmetrically and there is no tongue deviation. Hearing is intact to conversational tone. Sensation: Sensation is intact to light and pinprick throughout (facial, trunk, extremities). Vibration is intact at the bilateral big  toe. There is no extinction with double simultaneous stimulation. There is no sensory dermatomal level identified. Motor: Strength is 5/5 in the bilateral upper and lower extremities.  She does have decreased grip strength bilaterally.  Shoulder shrug is equal and symmetric.  There is no pronator drift. Deep tendon reflexes: Deep tendon reflexes are 3-3+/4 at the bilateral biceps, triceps, brachioradialis, patella and achilles. Reflexes get "hung up" in the UE and the LE bilaterally.  She has a striatal toe on the L.  Plantar responses is neutral on the right.  Movement examination: Tone: There is mild increased tone in the LUE Abnormal movements: there is LUE rest tremor.  There is postural tremor bilaterally Coordination:  There is mild decremation with RAM's, with finger taps on the right and toe taps on the L Gait and Station: The patient has pushes off of the chair to arise.  She is forward flexed.  She has right upper extremity tremor with ambulation.      Total time spent on today's visit was 60  minutes, including both face-to-face time and nonface-to-face time.  Time included that spent on review of records (prior notes available to me/labs/imaging if pertinent), discussing treatment and goals, answering patient's questions and coordinating care.  Cc:  Kelton Pillar, MD

## 2021-04-15 ENCOUNTER — Ambulatory Visit (INDEPENDENT_AMBULATORY_CARE_PROVIDER_SITE_OTHER): Payer: Medicare Other | Admitting: Neurology

## 2021-04-15 ENCOUNTER — Telehealth: Payer: Self-pay | Admitting: Pulmonary Disease

## 2021-04-15 ENCOUNTER — Encounter: Payer: Self-pay | Admitting: Neurology

## 2021-04-15 VITALS — BP 136/82 | HR 113 | Ht 60.0 in | Wt 178.0 lb

## 2021-04-15 DIAGNOSIS — M542 Cervicalgia: Secondary | ICD-10-CM | POA: Diagnosis not present

## 2021-04-15 DIAGNOSIS — R292 Abnormal reflex: Secondary | ICD-10-CM

## 2021-04-15 DIAGNOSIS — R296 Repeated falls: Secondary | ICD-10-CM

## 2021-04-15 DIAGNOSIS — G20 Parkinson's disease: Secondary | ICD-10-CM

## 2021-04-15 MED ORDER — CARBIDOPA-LEVODOPA 25-100 MG PO TABS: 1.0000 | ORAL_TABLET | Freq: Three times a day (TID) | ORAL | 1 refills | Status: DC

## 2021-04-15 NOTE — Telephone Encounter (Signed)
Spoke with the pt She is calling as FYI letting us know her preferred pharm  W already had it on file and will leave as is  She is okay on refills and will call pharm when needs next refill

## 2021-04-15 NOTE — Patient Instructions (Signed)
Start Carbidopa Levodopa as follows: Take 1/2 tablet three times daily, at least 30 minutes before meals (approximately 9am/1pm/5pm), for one week Then take 1/2 tablet in the morning, 1/2 tablet in the afternoon, 1 tablet in the evening, at least 30 minutes before meals, for one week Then take 1/2 tablet in the morning, 1 tablet in the afternoon, 1 tablet in the evening, at least 30 minutes before meals, for one week Then take 1 tablet three times daily at 9am/1pm/5pm, at least 30 minutes before meals   As a reminder, carbidopa/levodopa can be taken at the same time as a carbohydrate, but we like to have you take your pill either 30 minutes before a protein source or 1 hour after as protein can interfere with carbidopa/levodopa absorption.  

## 2021-04-16 ENCOUNTER — Other Ambulatory Visit: Payer: Self-pay | Admitting: Pulmonary Disease

## 2021-04-16 MED ORDER — AMOXICILLIN-POT CLAVULANATE 875-125 MG PO TABS: 1.0000 | ORAL_TABLET | Freq: Two times a day (BID) | ORAL | 0 refills | Status: DC

## 2021-04-24 ENCOUNTER — Institutional Professional Consult (permissible substitution): Payer: Medicare PPO | Admitting: Emergency Medicine

## 2021-04-28 ENCOUNTER — Other Ambulatory Visit: Payer: Self-pay

## 2021-04-28 ENCOUNTER — Encounter: Payer: Self-pay | Admitting: Physical Therapy

## 2021-04-28 ENCOUNTER — Ambulatory Visit: Payer: Medicare Other | Attending: Neurology | Admitting: Physical Therapy

## 2021-04-28 VITALS — HR 103

## 2021-04-28 DIAGNOSIS — Z9181 History of falling: Secondary | ICD-10-CM | POA: Insufficient documentation

## 2021-04-28 DIAGNOSIS — R292 Abnormal reflex: Secondary | ICD-10-CM | POA: Diagnosis not present

## 2021-04-28 DIAGNOSIS — R293 Abnormal posture: Secondary | ICD-10-CM | POA: Diagnosis not present

## 2021-04-28 DIAGNOSIS — R2681 Unsteadiness on feet: Secondary | ICD-10-CM | POA: Insufficient documentation

## 2021-04-28 DIAGNOSIS — M6281 Muscle weakness (generalized): Secondary | ICD-10-CM | POA: Diagnosis not present

## 2021-04-28 DIAGNOSIS — M47812 Spondylosis without myelopathy or radiculopathy, cervical region: Secondary | ICD-10-CM | POA: Diagnosis not present

## 2021-04-28 DIAGNOSIS — R2689 Other abnormalities of gait and mobility: Secondary | ICD-10-CM | POA: Diagnosis not present

## 2021-04-28 DIAGNOSIS — E236 Other disorders of pituitary gland: Secondary | ICD-10-CM | POA: Diagnosis not present

## 2021-04-28 NOTE — Therapy (Signed)
[facility] [address] [place], [place], [address] Phone: [phone]   Fax:  [phone]  Physical Therapy Evaluation  Patient Details  Name: Briana [name] MRN: [id number] Date of Birth: [birth year] Referring Provider (PT): Dr. Carles Collet   Encounter Date: 04/28/2021   PT End of Session - 04/28/21 1154    Visit Number 1    Number of Visits 9    Date for PT Re-Evaluation 07/27/21   written for 60 day POC   Authorization Type [employer] Medicare    PT Start Time 1012    PT Stop Time 1058    PT Time Calculation (min) 46 min    Equipment Utilized During Treatment Gait belt    Activity Tolerance Patient tolerated treatment well   limited by SOB   Behavior During Therapy [facility] for tasks assessed/performed           Past Medical History:  Diagnosis Date  . Depression   . Diabetes mellitus without complication ([place])   . High cholesterol   . Hypertension     Past Surgical History:  Procedure Laterality Date  . CATARACT EXTRACTION, BILATERAL      Vitals:   04/28/21 1028  Pulse: (!) 103  SpO2: 96%      Subjective Assessment - 04/28/21 1015    Subjective Has SOB and it has steadily gotten worse. Saw the pulmonologist and noticed that her hand had a tremor and then was referred to Dr. Carles Collet and was diagnosed with PD at the end of april of 2022. Dr. Carles Collet referring her for aquatic therapy. Reports that she feels clumsy and moves a little too fast that has lead to falls. Also working on trying to pick up her feet. Does not use a cane or any AD. Starting taking her carbidopa levodopa with no issues. Uses walking sticks when she walks outside with a friend about a mile a day (but needs rest breaks). Getting MRI of cervical spine and brain later today due to hyperreflexia (found by Dr. Carles Collet).    Pertinent History PMH: diabetes, HTN, hx of thoracic fx from falls, asthma, DOE    Limitations Walking    Patient Stated Goals would like to work  on her balance - does not pick up her feet    Currently in Pain? No/denies              [facility] PT Assessment - 04/28/21 1027      Assessment   Medical Diagnosis parkinsonism/frequent falls    Referring Provider (PT) Dr. Carles Collet    Onset Date/Surgical Date 04/15/21   when diagnosed   Hand Dominance Right    Prior Therapy previous PT after compression fxs      Precautions   Precautions Fall    Precaution Comments SOB      Balance Screen   Has the patient fallen in the past 6 months Yes    How many times? 5    Has the patient had a decrease in activity level because of a fear of falling?  No   not because of afraid, more so because pt is tired   Is the patient reluctant to leave their home because of a fear of falling?  No      Home Environment   Living Environment Private residence    Living Arrangements Alone    Type of Dwight to enter    [place] Two level  Alternate Level Stairs-Number of Steps 14    Alternate Level Stairs-Rails Can reach both    Home Equipment Other (comment);Grab bars - tub/shower;Grab bars - toilet   2 walking sticks   Additional Comments has bi-level house - coming in from garage have 7 steps and then a landing and more steps to get in. children come over and help with yard work      Prior Function   Level of Independence Independent    Leisure likes traveling      Development worker, international aid   Gross Motor Movements are Fluid and Coordinated No    Finger Nose Finger Test incr diffuclty with RUE, undershooting    Heel Shin Test slow to perform B      Posture/Postural Control   Posture/Postural Control Postural limitations    Postural Limitations Forward head;Rounded Shoulders;Increased thoracic kyphosis    Posture Comments R shoulder more elevated than L      ROM / Strength   AROM / PROM / Strength Strength      Strength   Strength Assessment Site Hip;Knee;Ankle    Right/Left Hip  Left;Right    Right Hip Flexion 4+/5    Left Hip Flexion 5/5    Right/Left Knee Right;Left    Right Knee Flexion 4/5    Right Knee Extension 4/5    Left Knee Flexion 5/5    Left Knee Extension 5/5    Right/Left Ankle Right;Left    Right Ankle Dorsiflexion 4-/5    Left Ankle Dorsiflexion 4+/5      Transfers   Transfers Sit to Stand;Stand to Sit    Sit to Stand 5: Supervision;With upper extremity assist    Five time sit to stand comments  22.10 seconds, stands straight up with BLE against chair, does not let go of hand rails once in standing due to feeling unsteady    Stand to Sit 5: Supervision;With upper extremity assist    Comments post O2: 98%, HR: 105 bpm      Ambulation/Gait   Ambulation/Gait Yes    Ambulation/Gait Assistance 5: Supervision    Assistive device None    Gait Pattern Step-through pattern;Decreased arm swing - right;Decreased dorsiflexion - right;Decreased dorsiflexion - left;Right foot flat;Left foot flat;Lateral trunk lean to right;Trunk flexed;Decreased trunk rotation   trunk lean to R   Ambulation Surface Level;Indoor    Gait velocity 20.03 seconds = 1.64 ft/sec      Standardized Balance Assessment   Standardized Balance Assessment Timed Up and Go Test      Timed Up and Go Test   Normal TUG (seconds) 10.97    Cognitive TUG (seconds) 17.13   starting at 77, counting backwards by 3     High Level Balance   High Level Balance Comments push and release; anterior = 2 steps and min guard for balance, posterior = 3 small steps and needing to grab onto the countertop for balance                      Objective measurements completed on examination: See above findings.               PT Education - 04/28/21 1153    Education Details clinical findings, POC, information about aquatic therapy.    Person(s) Educated Patient    Methods Explanation    Comprehension Verbalized understanding  PT Short Term Goals - 04/28/21 1158       PT SHORT TERM GOAL #1   Title Pt will be independent with initial HEP in order to build upon gains made in therapy for strength/balance. ALL STGS DUE 05/26/21    Time 4    Period Weeks    Status New    Target Date 05/26/21      PT SHORT TERM GOAL #2   Title Pt will initiate aquatic therapy.    Time 4    Period Weeks    Status New      PT SHORT TERM GOAL #3   Title Pt will improve gait speed to at least 1.85 f/tsec with no AD vs. LRAD in order to demo decr fall risk.    Baseline 1.64 ft/sec with no AD    Time 4    Period Weeks    Status New      PT SHORT TERM GOAL #4   Title Pt will decr 5x sit <> stand time to at least 19 seconds or less with BUE support in order to demo decr fall risk.    Baseline 22.10 seconds with BUE support and needs to hold onto arm rests for balance in standing    Time 4    Period Weeks    Status New             PT Long Term Goals - 04/28/21 1216      PT LONG TERM GOAL #1   Title Pt will be independent with final HEP in order to build upon gains made in therapy for strength/balance. ALL LTGS DUE 06/23/21    Time 8    Period Weeks    Status New    Target Date 06/23/21      PT LONG TERM GOAL #2   Title Pt will decr cog TUG to 14.5 seconds or less in order to demo decr fall risk.    Baseline 17.13 seconds.    Time 8    Period Weeks    Status New      PT LONG TERM GOAL #3   Title Pt will ambulate at least 250' over outdoor surfaces with LRAD with supervision in order to demo improved tolerance to ambulating outdoors daily with her friend.    Time 8    Period Weeks    Status New      PT LONG TERM GOAL #4   Title Pt will improve gait speed to at least 2.2 f/tsec with no AD vs. LRAD in order to demo decr fall risk.    Baseline 1.64 ft/sec    Time 8    Period Weeks    Status New      PT LONG TERM GOAL #5   Title Pt will recover anterior and posterior balance in push and release test in 2 or less steps independently, for improved balance  recovery    Baseline anterior = 2 steps and min guard for balance, posterior = 3 small steps and needing to grab counter for balance    Time 8    Period Weeks    Status New                  Plan - 04/28/21 1159    Clinical Impression Statement Patient is a 74 year old female referred to Neuro OPPT for parkinsonism/frequent falls. Pt newly diagosed with parkinsonism (suspected idiopathic Parkinson's disease per Dr. Carles Collet). Pt referred for aquatic therapy due  to pt's SOB and lung function as pt might not be able to tolerate land PT as well.   Pt's PMH is significant for: diabetes, HTN, hx of compression thoracic fx from falls, asthma, DOE.   The following deficits were present during the exam: decr activity tolerance, postural abnormalities, gait abnormalities, impaired balance, decr strength, impaired coordination.  Based on gait speed, 5x sit <> stand, and TUG pt is an incr risk for falls. Pt would benefit from skilled PT to address these impairments and functional limitations to maximize functional mobility independence    Personal Factors and Comorbidities Comorbidity 3+;Past/Current Experience    Comorbidities PMH: diabetes, HTN, hx of compression thoracic fx from falls, asthma, DOE    Examination-Activity Limitations Stairs;Squat;Transfers;Locomotion Level    Examination-Participation Restrictions Community Activity;Yard Work    Merchant navy officer Evolving/Moderate complexity    Clinical Decision Making Moderate    Rehab Potential Good    PT Frequency 1x / week    PT Duration 8 weeks    PT Treatment/Interventions ADLs/Self Care Home Management;Aquatic Therapy;DME Instruction;Gait training;Stair training;Functional mobility training;Therapeutic activities;Neuromuscular re-education;Balance training;Therapeutic exercise;Patient/family education;Vestibular;Energy conservation    PT Next Visit Plan initial HEP for general strengthening, standing balance strategies for weight  shifting and stepping. see if pt can tolerate seated PWR moves. pt needs seated rest breaks due to SOB    Recommended Other Services aquatic therapy.    Consulted and Agree with Plan of Care Patient           Patient will benefit from skilled therapeutic intervention in order to improve the following deficits and impairments:  Abnormal gait,Decreased activity tolerance,Cardiopulmonary status limiting activity,Decreased coordination,Decreased balance,Decreased endurance,Difficulty walking,Decreased strength,Postural dysfunction  Visit Diagnosis: Unsteadiness on feet  History of falling  Other abnormalities of gait and mobility  Muscle weakness (generalized)  Abnormal posture     Problem List Patient Active Problem List   Diagnosis Date Noted  . Dyspnea on exertion 04/09/2021  . Coarse tremors 04/09/2021    Arliss Journey, PT, DPT  04/28/2021, 12:19 PM  [facility] [address] [place], [place], [address] Phone: [phone]   Fax:  [phone]  Name: Briana [name] MRN: [id number] Date of Birth: 07/25/1946

## 2021-04-30 ENCOUNTER — Ambulatory Visit
Admission: RE | Admit: 2021-04-30 | Discharge: 2021-04-30 | Disposition: A | Discharge status: Home / Self Care | Payer: Self-pay | Source: Ambulatory Visit | Attending: Neurology | Admitting: Neurology

## 2021-04-30 ENCOUNTER — Telehealth: Payer: Self-pay | Admitting: Neurology

## 2021-04-30 DIAGNOSIS — M542 Cervicalgia: Secondary | ICD-10-CM

## 2021-04-30 NOTE — Telephone Encounter (Signed)
Patient returned call to [facility].

## 2021-04-30 NOTE — Telephone Encounter (Signed)
Pt stated ---wanted to ahead order the referral to neurosurgery. Called [facility] and [facility] to pushed over the MRI.

## 2021-04-30 NOTE — Telephone Encounter (Signed)
LVM--to call the office back. 

## 2021-04-30 NOTE — Telephone Encounter (Addendum)
1.  Please call [facility] and ask them to push her MRI images to [facility]are and then call [facility] and ask them to pull them from powershare for [place] so we can view them 2.   Call patient and let her know that MRI brain looked fine (meaning nothing new on it) according to the report.  Her cervical spine was reported to show severe degenerative and arthritic changes.  If she is having neck pain, we should refer to neurosx.

## 2021-04-30 NOTE — Telephone Encounter (Signed)
Follow prior directions (in my previous message - call [facility]) as the CD won't load images properly.  The arm pain could be due to all the degenerative changes in the neck.  She can let us know if she would like neurosx referral and we will send

## 2021-04-30 NOTE — Telephone Encounter (Signed)
Notified pt with MRI results by Dr. Carles Collet. Pt stated not much pain in the neck much more in the arm.  Received copy of CD--MRI Brain from [facility] and placed Dr. Carles Collet desk.

## 2021-05-01 NOTE — Telephone Encounter (Signed)
Send referral to Dr Zada Finders, [facility] neurosx for neck/arm pain/hyperreflexia, abnormal MRI cervical spine and send reports of her c-spine

## 2021-05-02 ENCOUNTER — Other Ambulatory Visit: Payer: Self-pay | Admitting: *Deleted

## 2021-05-02 DIAGNOSIS — R292 Abnormal reflex: Secondary | ICD-10-CM

## 2021-05-02 DIAGNOSIS — M542 Cervicalgia: Secondary | ICD-10-CM

## 2021-05-02 NOTE — Telephone Encounter (Signed)
Films uploaded to powershare.  reviewed

## 2021-05-02 NOTE — Telephone Encounter (Signed)
Sent/faxed referral to Dr. Joaquim Nam and sent  copy c-spine report.

## 2021-05-05 ENCOUNTER — Other Ambulatory Visit (HOSPITAL_COMMUNITY)
Admission: RE | Admit: 2021-05-05 | Discharge: 2021-05-05 | Disposition: A | Discharge status: Home / Self Care | Payer: Medicare Other | Source: Ambulatory Visit | Attending: Pulmonary Disease | Admitting: Pulmonary Disease

## 2021-05-05 ENCOUNTER — Ambulatory Visit: Payer: Self-pay | Admitting: Physical Therapy

## 2021-05-05 DIAGNOSIS — Z01812 Encounter for preprocedural laboratory examination: Secondary | ICD-10-CM | POA: Diagnosis not present

## 2021-05-05 DIAGNOSIS — Z20822 Contact with and (suspected) exposure to covid-19: Secondary | ICD-10-CM | POA: Diagnosis not present

## 2021-05-06 ENCOUNTER — Ambulatory Visit: Payer: Medicare Other | Admitting: Physical Therapy

## 2021-05-06 LAB — SARS CORONAVIRUS 2 (TAT 6-24 HRS): SARS Coronavirus 2: NEGATIVE

## 2021-05-07 ENCOUNTER — Encounter: Payer: Self-pay | Admitting: Adult Health

## 2021-05-07 ENCOUNTER — Ambulatory Visit (INDEPENDENT_AMBULATORY_CARE_PROVIDER_SITE_OTHER): Payer: Medicare Other | Admitting: Pulmonary Disease

## 2021-05-07 ENCOUNTER — Other Ambulatory Visit: Payer: Self-pay

## 2021-05-07 ENCOUNTER — Ambulatory Visit (INDEPENDENT_AMBULATORY_CARE_PROVIDER_SITE_OTHER): Payer: Medicare Other | Admitting: Adult Health

## 2021-05-07 VITALS — BP 114/70 | HR 104 | Temp 97.3°F | Ht 60.0 in | Wt 175.0 lb

## 2021-05-07 DIAGNOSIS — J4530 Mild persistent asthma, uncomplicated: Secondary | ICD-10-CM

## 2021-05-07 DIAGNOSIS — R0600 Dyspnea, unspecified: Secondary | ICD-10-CM

## 2021-05-07 DIAGNOSIS — J984 Other disorders of lung: Secondary | ICD-10-CM

## 2021-05-07 DIAGNOSIS — G252 Other specified forms of tremor: Secondary | ICD-10-CM | POA: Diagnosis not present

## 2021-05-07 DIAGNOSIS — R0689 Other abnormalities of breathing: Secondary | ICD-10-CM | POA: Diagnosis not present

## 2021-05-07 DIAGNOSIS — R0609 Other forms of dyspnea: Secondary | ICD-10-CM

## 2021-05-07 LAB — PULMONARY FUNCTION TEST
DL/VA % pred: 153 %
DL/VA: 6.47 ml/min/mmHg/L
DLCO cor % pred: 108 %
DLCO cor: 18.1 ml/min/mmHg
DLCO unc % pred: 108 %
DLCO unc: 18.1 ml/min/mmHg
FEF 25-75 Post: 1.37 L/sec
FEF 25-75 Pre: 1.37 L/sec
FEF2575-%Change-Post: 0 %
FEF2575-%Pred-Post: 95 %
FEF2575-%Pred-Pre: 95 %
FEV1-%Change-Post: 0 %
FEV1-%Pred-Post: 68 %
FEV1-%Pred-Pre: 69 %
FEV1-Post: 1.2 L
FEV1-Pre: 1.2 L
FEV1FVC-%Change-Post: 2 %
FEV1FVC-%Pred-Pre: 113 %
FEV6-%Change-Post: -2 %
FEV6-%Pred-Post: 62 %
FEV6-%Pred-Pre: 64 %
FEV6-Post: 1.38 L
FEV6-Pre: 1.42 L
FEV6FVC-%Pred-Post: 105 %
FEV6FVC-%Pred-Pre: 105 %
FVC-%Change-Post: -2 %
FVC-%Pred-Post: 59 %
FVC-%Pred-Pre: 60 %
FVC-Post: 1.38 L
FVC-Pre: 1.42 L
Post FEV1/FVC ratio: 87 %
Post FEV6/FVC ratio: 100 %
Pre FEV1/FVC ratio: 85 %
Pre FEV6/FVC Ratio: 100 %
RV % pred: 88 %
RV: 1.85 L
TLC % pred: 78 %
TLC: 3.51 L

## 2021-05-07 NOTE — Patient Instructions (Addendum)
Echo report from Primary MD  Continue on Symbicort 2 puffs Twice daily   Albuterol inhaler As needed   Activity as tolerated.  Therapy as planned per Neuro.  Set up for HRCT chest .  Follow up with Dr. Elsworth Soho  In 3 months and As needed

## 2021-05-07 NOTE — Progress Notes (Signed)
@Patient  ID: Briana [name], female    DOB: 02/02/1946, 75 y.o.   MRN: [id number]  No chief complaint on file.   Referring provider: Kelton Pillar, MD  HPI: 75 year old female former smoker quit in 2000 with a 10-pack-year history.  Seen for pulmonary consult April 09, 2021 for shortness of breath and wheezing. Diagnosed with asthma by her primary care provider in 2018.  TEST/EVENTS :  April 09, 2021 Ambulatory saturation -heart rate increased from 98-1 1 7  and oxygen saturation dropped minimally from 97 to 95% on room air, stopped after 2 labs due to increased dyspnea and took 2 minutes to return to baseline  PFTs 12/2010 near nml CT T-spine 06/2018 >> compression deformities at T6, T11and T12 acute T8 fracure Degenerative disc disease of C-spine noted  05/07/2021 Follow up dyspnea:  Patient returns for a 1 month follow-up.  Patient was seen last visit for pulmonary consult for shortness of breath and wheezing  She was set up for pulmonary function testing that showed moderate restriction with no airflow obstruction.  FEV1 was 69%, ratio 85, FVC 60%, DLCO 108%.,  No significant bronchodilator response. Chest x-ray last visit showed mild left lower lobe infiltrate vs atx .,  Low lung volumes and elevated diaphragms.  She did undergo a sniff test what was normal She was referred to neurology for tremors and brisk reflexes.  Neurology notes were reviewed and there is concern for possible underlying Parkinson's disease.  She was started on carbidopa/levodopa she has been set up for MRI of the neck and brain which results are pending On symbicort feels it helps some .  Dyspnea with walking , esp with stairs. Slowly progressive over last few years.  Lives alone , drives, does housework.  Adult children lives 1-2 hrs a way .   Falls and balance issues have been getting worse for last year.      No Known Allergies   There is no immunization history on file for this patient.  Past  Medical History:  Diagnosis Date  . Depression   . Diabetes mellitus without complication ([place])   . High cholesterol   . Hypertension     Tobacco History: Social History   Tobacco Use  Smoking Status Former Smoker  Smokeless Tobacco Never Used   Counseling given: Not Answered   Outpatient Medications Prior to Visit  Medication Sig Dispense Refill  . amoxicillin-clavulanate (AUGMENTIN) 875-125 MG tablet Take 1 tablet by mouth 2 (two) times daily. (Patient not taking: Reported on 04/28/2021) 14 tablet 0  . albuterol (VENTOLIN HFA) 108 (90 Base) MCG/ACT inhaler Inhale 1 puff into the lungs every 6 (six) hours as needed for wheezing or shortness of breath. Patient reports albuterol inhaler use, 1 puff as needed. She does not know the specific dose /previous written order details    . atorvastatin (LIPITOR) 40 MG tablet Take 40 mg by mouth daily.    . budesonide-formoterol (SYMBICORT) 80-4.5 MCG/ACT inhaler Inhale 2 puffs into the lungs 2 (two) times daily. Patient reports she does not know specific dose/orders; only able to state that she has been prescribed Symbicort and Albuterol inhalers in the past. Dr. Elsworth Soho aware; no new orders rcvd.    Marland Kitchen buPROPion (WELLBUTRIN XL) 150 MG 24 hr tablet Take 150 mg by mouth daily.    . calcium-vitamin D (OSCAL WITH D) 250-125 MG-UNIT tablet Take 1 tablet by mouth daily.    . carbidopa-levodopa (SINEMET IR) 25-100 MG tablet Take 1 tablet by mouth 3 (  three) times daily. 270 tablet 1  . docusate sodium (COLACE) 100 MG capsule Take 1 capsule (100 mg total) by mouth daily. (Patient not taking: Reported on 04/15/2021) 60 capsule 0  . pantoprazole (PROTONIX) 20 MG tablet Take 20 mg by mouth daily.    . sertraline (ZOLOFT) 100 MG tablet Take 100 mg by mouth daily.    . traMADol (ULTRAM) 50 MG tablet Take 1 tablet (50 mg total) by mouth every 6 (six) hours as needed. (Patient not taking: Reported on 04/15/2021) 15 tablet 0  . valsartan-hydrochlorothiazide  (DIOVAN-HCT) 160-12.5 MG tablet Take 1 tablet by mouth daily.     No facility-administered medications prior to visit.     Review of Systems:   Constitutional:   No  weight loss, night sweats,  Fevers, chills,  +fatigue, or  lassitude.  HEENT:   No headaches,  Difficulty swallowing,  Tooth/dental problems, or  Sore throat,                No sneezing, itching, ear ache, nasal congestion, post nasal drip,   CV:  No chest pain,  Orthopnea, PND, swelling in lower extremities, anasarca, dizziness, palpitations, syncope.   GI  No heartburn, indigestion, abdominal pain, nausea, vomiting, diarrhea, change in bowel habits, loss of appetite, bloody stools.   Resp:    No chest wall deformity  Skin: no rash or lesions.  GU: no dysuria, change in color of urine, no urgency or frequency.  No flank pain, no hematuria   MS:  Balance , gait issues, joint stiffness    Physical Exam    GEN: A/Ox3; pleasant , NAD, well nourished    HEENT:  [place]/AT,   , NOSE-clear, THROAT-clear, no lesions, no postnasal drip or exudate noted.   NECK:  Supple w/ fair ROM; no JVD; normal carotid impulses w/o bruits; no thyromegaly or nodules palpated; no lymphadenopathy.    RESP  BB crackles . no accessory muscle use, no dullness to percussion  CARD:  RRR, no m/r/g, tr  peripheral edema, pulses intact, no cyanosis or clubbing.  GI:   Soft & nt; nml bowel sounds; no organomegaly or masses detected.   Musco: Warm bil, no deformities or joint swelling noted.   Neuro: alert, no focal deficits noted.    Skin: Warm, no lesions or rashes    Lab Results:  CBC No results found for: WBC, RBC, HGB, HCT, PLT, MCV, MCH, MCHC, RDW, LYMPHSABS, MONOABS, EOSABS, BASOSABS  BMET No results found for: NA, K, CL, CO2, GLUCOSE, BUN, CREATININE, CALCIUM, GFRNONAA, GFRAA  BNP No results found for: BNP  ProBNP No results found for: PROBNP  Imaging: DG Chest 2 View  Result Date: 04/10/2021 CLINICAL DATA:  Shortness  of breath. EXAM: CHEST - 2 VIEW COMPARISON:  07/12/2018. FINDINGS: Mediastinum hilar structures normal. Heart size normal. Low lung volumes. Mild left lower lobe infiltrate. No pleural effusion or pneumothorax. Degenerative change thoracic spine. Stable thoracic spine compression fractures. IMPRESSION: Lung volumes.  Mild left lower lobe infiltrate. Electronically Signed   By: Marcello Moores  Register   On: 04/10/2021 14:56   DG Sniff Test  Result Date: 04/14/2021 CLINICAL DATA:  Dyspnea EXAM: CHEST FLUOROSCOPY TECHNIQUE: Real-time fluoroscopic evaluation of the chest was performed. FLUOROSCOPY TIME:  Fluoroscopy Time:  36 seconds Radiation Exposure Index (if provided by the fluoroscopic device): 3.8 mGy Number of Acquired Spot Images: 0 COMPARISON:  Chest x-ray from May 10, 2021 FINDINGS: Fluoroscopic imaging was obtained in quiet respiration and while "sniffing". There is  symmetric motion of the LEFT and RIGHT hemidiaphragm, no paradoxical motion was identified. IMPRESSION: Normal sniff test. Electronically Signed   By: Zetta Bills M.D.   On: 04/14/2021 10:40      PFT Results Latest Ref Rng & Units 05/07/2021  FVC-Pre L 1.42  FVC-Predicted Pre % 60  FVC-Post L 1.38  FVC-Predicted Post % 59  Pre FEV1/FVC % % 85  Post FEV1/FCV % % 87  FEV1-Pre L 1.20  FEV1-Predicted Pre % 69  FEV1-Post L 1.20  DLCO uncorrected ml/min/mmHg 18.10  DLCO UNC% % 108  DLCO corrected ml/min/mmHg 18.10  DLCO COR %Predicted % 108  DLVA Predicted % 153  TLC L 3.51  TLC % Predicted % 78  RV % Predicted % 88    No results found for: NITRICOXIDE      Assessment & Plan:   No problem-specific Assessment & Plan notes found for this encounter.     Rexene Edison, NP 05/07/2021

## 2021-05-07 NOTE — Progress Notes (Signed)
Full PFT performed today. °

## 2021-05-07 NOTE — Patient Instructions (Signed)
Full PFT performed today. °

## 2021-05-08 DIAGNOSIS — J45909 Unspecified asthma, uncomplicated: Secondary | ICD-10-CM | POA: Insufficient documentation

## 2021-05-08 NOTE — Assessment & Plan Note (Signed)
May have component of Asthma - cont current regimen with Symbicort   Plan Patient Instructions  Echo report from Primary MD  Continue on Symbicort 2 puffs Twice daily   Albuterol inhaler As needed   Activity as tolerated.  Therapy as planned per Neuro.  Set up for HRCT chest .  Follow up with Dr. Elsworth Soho  In 3 months and As needed

## 2021-05-08 NOTE — Assessment & Plan Note (Signed)
Continue follow-up with neurology with newly diagnosed Parkinson's Continue on current regimen and follow-up along with therapy

## 2021-05-08 NOTE — Assessment & Plan Note (Signed)
Suspect is multifactoral with deconditioning . Possible asthma component  She does have crackles on exam .  Moderate restriction on PFTs. 2D echo results have been requested. Check high-resolution CT chest.  Plan  Patient Instructions  Echo report from Primary MD  Continue on Symbicort 2 puffs Twice daily   Albuterol inhaler As needed   Activity as tolerated.  Therapy as planned per Neuro.  Set up for HRCT chest .  Follow up with Dr. Elsworth Soho  In 3 months and As needed

## 2021-05-13 ENCOUNTER — Encounter: Payer: Self-pay | Admitting: Physical Therapy

## 2021-05-13 ENCOUNTER — Other Ambulatory Visit: Payer: Self-pay

## 2021-05-13 ENCOUNTER — Ambulatory Visit: Payer: Medicare Other | Admitting: Physical Therapy

## 2021-05-13 DIAGNOSIS — M6281 Muscle weakness (generalized): Secondary | ICD-10-CM | POA: Diagnosis not present

## 2021-05-13 DIAGNOSIS — Z9181 History of falling: Secondary | ICD-10-CM

## 2021-05-13 DIAGNOSIS — R2681 Unsteadiness on feet: Secondary | ICD-10-CM | POA: Diagnosis not present

## 2021-05-13 DIAGNOSIS — R2689 Other abnormalities of gait and mobility: Secondary | ICD-10-CM | POA: Diagnosis not present

## 2021-05-13 DIAGNOSIS — R293 Abnormal posture: Secondary | ICD-10-CM | POA: Diagnosis not present

## 2021-05-13 NOTE — Therapy (Signed)
[facility] [address] [place], [place], [address] Phone: [phone]   Fax:  [phone]  Physical Therapy Treatment  Patient Details  Name: Briana [name] MRN: [id number] Date of Birth: 05-11-1946 Referring Provider (PT): Dr. Carles Collet   Encounter Date: 05/13/2021   PT End of Session - 05/13/21 1338    Visit Number 2    Number of Visits 9    Date for PT Re-Evaluation 07/27/21   written for 60 day POC   Authorization Type UHC Medicare    PT Start Time 1146    PT Stop Time 1230    PT Time Calculation (min) 44 min    Equipment Utilized During Treatment Gait belt    Activity Tolerance Patient tolerated treatment well   limited by SOB   Behavior During Therapy [facility] for tasks assessed/performed           Past Medical History:  Diagnosis Date  . Depression   . Diabetes mellitus without complication ([place])   . High cholesterol   . Hypertension     Past Surgical History:  Procedure Laterality Date  . CATARACT EXTRACTION, BILATERAL      There were no vitals filed for this visit.   Subjective Assessment - 05/13/21 1148    Subjective No changes, no falls.    Pertinent History PMH: diabetes, HTN, hx of thoracic fx from falls, asthma, DOE    Limitations Walking    Patient Stated Goals would like to work on her balance - does not pick up her feet    Currently in Pain? Other (Comment)   "back always hurts"                            [facility] Adult PT Treatment/Exercise - 05/13/21 1211      Ambulation/Gait   Ambulation/Gait Yes    Ambulation/Gait Assistance 5: Supervision    Ambulation/Gait Assistance Details cues for incr step length, pt getting incr SOB with no AD after 70' and needing to sit and rest. trialed use of rollator with pt able to ambulate 230' with no rest break and decr SOB - pt able to demo improved upright posture and foot clearance, cues to stay close to rollator at times, also  ambulated with 2 walking poles (what pt currently uses) - ambulated 1 lap before SOB with intermittent cues for incr step length. pt reports that walking poles feel better at this time than rollator due to balance.would benefit from more practice    Ambulation Distance (Feet) 70 Feet   115 x 1, 230 x 1   Assistive device None;Other (Comment)   walking pole, rollator   Gait Pattern Step-through pattern;Decreased arm swing - right;Decreased dorsiflexion - right;Decreased dorsiflexion - left;Right foot flat;Left foot flat;Lateral trunk lean to right;Trunk flexed;Decreased trunk rotation   trunk lean to R   Ambulation Surface Level;Indoor             Access Code: [email or web address] URL: [email or web address] Date: 05/13/2021 Prepared by: Janann August  Initiated [place] for more details.   Program Notes modified quadraped PWR Up at countertop x10 reps    Exercises Side to Side Weight Shift with Counter Support - 2 x daily - 5 x weekly - 1 sets - 10 reps - with UE support as needed  Alternating Step Backward with Support - 2 x daily - 5 x weekly - 2 sets - 5 reps - initial  cues for weight shifting  Alternating Step Forward with Support - 2 x daily - 5 x weekly - 2 sets - 5 reps - initial cues for weight shifting and step height when stepping back to midline       PT Education - 05/13/21 1338    Education Details getting scheduled for aquatic therapy next week, initial HEP    Person(s) Educated Patient    Methods Explanation;Demonstration;Handout    Comprehension Verbalized understanding;Returned demonstration            PT Short Term Goals - 04/28/21 1158      PT SHORT TERM GOAL #1   Title Pt will be independent with initial HEP in order to build upon gains made in therapy for strength/balance. ALL STGS DUE 05/26/21    Time 4    Period Weeks    Status New    Target Date 05/26/21      PT SHORT TERM GOAL #2   Title Pt will initiate aquatic therapy.    Time  4    Period Weeks    Status New      PT SHORT TERM GOAL #3   Title Pt will improve gait speed to at least 1.85 f/tsec with no AD vs. LRAD in order to demo decr fall risk.    Baseline 1.64 ft/sec with no AD    Time 4    Period Weeks    Status New      PT SHORT TERM GOAL #4   Title Pt will decr 5x sit <> stand time to at least 19 seconds or less with BUE support in order to demo decr fall risk.    Baseline 22.10 seconds with BUE support and needs to hold onto arm rests for balance in standing    Time 4    Period Weeks    Status New             PT Long Term Goals - 04/28/21 1216      PT LONG TERM GOAL #1   Title Pt will be independent with final HEP in order to build upon gains made in therapy for strength/balance. ALL LTGS DUE 06/23/21    Time 8    Period Weeks    Status New    Target Date 06/23/21      PT LONG TERM GOAL #2   Title Pt will decr cog TUG to 14.5 seconds or less in order to demo decr fall risk.    Baseline 17.13 seconds.    Time 8    Period Weeks    Status New      PT LONG TERM GOAL #3   Title Pt will ambulate at least 250' over outdoor surfaces with LRAD with supervision in order to demo improved tolerance to ambulating outdoors daily with her friend.    Time 8    Period Weeks    Status New      PT LONG TERM GOAL #4   Title Pt will improve gait speed to at least 2.2 f/tsec with no AD vs. LRAD in order to demo decr fall risk.    Baseline 1.64 ft/sec    Time 8    Period Weeks    Status New      PT LONG TERM GOAL #5   Title Pt will recover anterior and posterior balance in push and release test in 2 or less steps independently, for improved balance recovery    Baseline anterior = 2 steps  and min guard for balance, posterior = 3 small steps and needing to grab counter for balance    Time 8    Period Weeks    Status New                 Plan - 05/13/21 1345    Clinical Impression Statement Focus of today's skilled session was initiating HEP at  countertop for posture, balance, and stepping strategies. Pt needing seated and standing rest breaks at times due to SOB - assessed HR and O2 throughout, HR incr to 100-110 bpm after activity and O2 sats remained between 97-98%. without use of AD, pt only able to ambulate 25' before needing a rest break, with use of rollator pt able to walk 230'. Pt currently uses 2 walking poles when ambulating outside, performs well but does get more SOB. Will continue to progress towards LTGs.    Personal Factors and Comorbidities Comorbidity 3+;Past/Current Experience    Comorbidities PMH: diabetes, HTN, hx of compression thoracic fx from falls, asthma, DOE    Examination-Activity Limitations Stairs;Squat;Transfers;Locomotion Level    Examination-Participation Restrictions Community Activity;Yard Work    [employer] Evolving/Moderate complexity    Rehab Potential Good    PT Frequency 1x / week    PT Duration 8 weeks    PT Treatment/Interventions ADLs/Self Care Home Management;Aquatic Therapy;DME Instruction;Gait training;Stair training;Functional mobility training;Therapeutic activities;Neuromuscular re-education;Balance training;Therapeutic exercise;Patient/family education;Vestibular;Energy conservation    PT Next Visit Plan work on posture, gait, and standing balance strategies for weight shifting and stepping. see if pt can tolerate seated PWR moves. pt needs seated rest breaks due to SOB    Consulted and Agree with Plan of Care Patient           Patient will benefit from skilled therapeutic intervention in order to improve the following deficits and impairments:  Abnormal gait,Decreased activity tolerance,Cardiopulmonary status limiting activity,Decreased coordination,Decreased balance,Decreased endurance,Difficulty walking,Decreased strength,Postural dysfunction  Visit Diagnosis: History of falling  Unsteadiness on feet  Other abnormalities of gait and mobility  Muscle  weakness (generalized)     Problem List Patient Active Problem List   Diagnosis Date Noted  . Asthma 05/08/2021  . Dyspnea on exertion 04/09/2021  . Coarse tremors 04/09/2021    Arliss Journey ,PT, DPT  05/13/2021, 1:47 PM  [facility] [address] [place], [place], [address] Phone: [phone]   Fax:  [phone]  Name: Briana [name] MRN: [id number] Date of Birth: [birth year]

## 2021-05-14 DIAGNOSIS — I10 Essential (primary) hypertension: Secondary | ICD-10-CM | POA: Diagnosis not present

## 2021-05-14 DIAGNOSIS — G959 Disease of spinal cord, unspecified: Secondary | ICD-10-CM | POA: Diagnosis not present

## 2021-05-20 ENCOUNTER — Encounter: Payer: Self-pay | Admitting: Physical Therapy

## 2021-05-20 ENCOUNTER — Other Ambulatory Visit: Payer: Self-pay

## 2021-05-20 ENCOUNTER — Ambulatory Visit: Payer: Self-pay | Admitting: Physical Therapy

## 2021-05-20 DIAGNOSIS — R2689 Other abnormalities of gait and mobility: Secondary | ICD-10-CM

## 2021-05-20 DIAGNOSIS — Z9181 History of falling: Secondary | ICD-10-CM | POA: Diagnosis not present

## 2021-05-20 DIAGNOSIS — M6281 Muscle weakness (generalized): Secondary | ICD-10-CM

## 2021-05-20 DIAGNOSIS — R2681 Unsteadiness on feet: Secondary | ICD-10-CM

## 2021-05-20 DIAGNOSIS — R293 Abnormal posture: Secondary | ICD-10-CM | POA: Diagnosis not present

## 2021-05-20 NOTE — Therapy (Addendum)
[facility] [address] [place], [place], [address] Phone: [phone]   Fax:  [phone]  Physical Therapy Treatment  Patient Details  Name: Briana [name] MRN: [id number] Date of Birth: 29-Jan-1946 Referring Provider (PT): Dr. Carles Collet   Encounter Date: 05/20/2021   PT End of Session - 05/20/21 1403    Visit Number 3    Number of Visits 9    Date for PT Re-Evaluation 07/27/21   written for 60 day POC   Authorization Type [employer] Medicare    PT Start Time 1100    PT Stop Time 1145    PT Time Calculation (min) 45 min    Equipment Utilized During Treatment --   pool noodle   Activity Tolerance Patient tolerated treatment well   limited by SOB   Behavior During Therapy [facility] for tasks assessed/performed           Past Medical History:  Diagnosis Date  . Depression   . Diabetes mellitus without complication ([place])   . High cholesterol   . Hypertension     Past Surgical History:  Procedure Laterality Date  . CATARACT EXTRACTION, BILATERAL      There were no vitals filed for this visit.   Subjective Assessment - 05/20/21 1402    Subjective Pt presents for first aquatic session.  Denies any falls or changes.    Pertinent History PMH: diabetes, HTN, hx of thoracic fx from falls, asthma, DOE    Limitations Walking    Patient Stated Goals would like to work on her balance - does not pick up her feet    Currently in Pain? No/denies           Aquatic therapy at [facility] Patient seen for aquatic therapy today. Treatment took place in water 3.6-16feet deep depending upon activity. Pt entered and exited the the pool steps with bil rails and close supervision.  Runners stretch at pool edge bil LE x 30 sec  Pt performed trunk strengthening/core stabilization exercises - seated on bench in pool - pt held pool noodle - performed shoulder protraction/retraction20reps; trunk rotation to Rt and Lt sides holding pool  noodlewith elbows extended 20reps to each side; Ptneeding to stabilize by leaning against pool wall..  Pt gait trained in3.6-4 ft and pool noodle with PTA initially supporting noodle progressing to close supervision.  Pt gait trained 18' x6reps.  Pt facing pool wall and performed bil LE marching x 15 with UE support, PWR Up x 10, side step with intermittent UE support and forward step with intermittent UE support.  Pt performed bil LE strengthening seated on bench in pool - LAQ's2sets 10reps and marching 2 sets x 10 reps with pt using bil. UE's to maintain balance;   Buoyancy needed for support and for safety for reduced fall risk with activities in standing and with gait training. Viscosity of water needed for resistance for strengthening for improved core stabilization and for LE strengthening.  Pt also benefited from ease of movement due to buoyancy to decrease SOB with activities.      PT Short Term Goals - 04/28/21 1158      PT SHORT TERM GOAL #1   Title Pt will be independent with initial HEP in order to build upon gains made in therapy for strength/balance. ALL STGS DUE 05/26/21    Time 4    Period Weeks    Status New    Target Date 05/26/21      PT SHORT  TERM GOAL #2   Title Pt will initiate aquatic therapy.    Time 4    Period Weeks    Status New      PT SHORT TERM GOAL #3   Title Pt will improve gait speed to at least 1.85 f/tsec with no AD vs. LRAD in order to demo decr fall risk.    Baseline 1.64 ft/sec with no AD    Time 4    Period Weeks    Status New      PT SHORT TERM GOAL #4   Title Pt will decr 5x sit <> stand time to at least 19 seconds or less with BUE support in order to demo decr fall risk.    Baseline 22.10 seconds with BUE support and needs to hold onto arm rests for balance in standing    Time 4    Period Weeks    Status New             PT Long Term Goals - 04/28/21 1216      PT LONG TERM GOAL #1   Title Pt will be  independent with final HEP in order to build upon gains made in therapy for strength/balance. ALL LTGS DUE 06/23/21    Time 8    Period Weeks    Status New    Target Date 06/23/21      PT LONG TERM GOAL #2   Title Pt will decr cog TUG to 14.5 seconds or less in order to demo decr fall risk.    Baseline 17.13 seconds.    Time 8    Period Weeks    Status New      PT LONG TERM GOAL #3   Title Pt will ambulate at least 250' over outdoor surfaces with LRAD with supervision in order to demo improved tolerance to ambulating outdoors daily with her friend.    Time 8    Period Weeks    Status New      PT LONG TERM GOAL #4   Title Pt will improve gait speed to at least 2.2 f/tsec with no AD vs. LRAD in order to demo decr fall risk.    Baseline 1.64 ft/sec    Time 8    Period Weeks    Status New      PT LONG TERM GOAL #5   Title Pt will recover anterior and posterior balance in push and release test in 2 or less steps independently, for improved balance recovery    Baseline anterior = 2 steps and min guard for balance, posterior = 3 small steps and needing to grab counter for balance    Time 8    Period Weeks    Status New                 Plan - 05/20/21 1404    Clinical Impression Statement Pt tolerated sesssion well with not SOB with exercises/activity.  Pt challenged by balance with activities. Pt prefering to continue with aquatic sessions at this time vs land.  Cont per poc.    Personal Factors and Comorbidities Comorbidity 3+;Past/Current Experience    Comorbidities PMH: diabetes, HTN, hx of compression thoracic fx from falls, asthma, DOE    Examination-Activity Limitations Stairs;Squat;Transfers;Locomotion Level    Examination-Participation Restrictions Community Activity;Yard Work    Stability/Clinical Decision Making Evolving/Moderate complexity    Rehab Potential Good    PT Frequency 1x / week    PT Duration 8 weeks  PT Treatment/Interventions ADLs/Self Care Home  Management;Aquatic Therapy;DME Instruction;Gait training;Stair training;Functional mobility training;Therapeutic activities;Neuromuscular re-education;Balance training;Therapeutic exercise;Patient/family education;Vestibular;Energy conservation    PT Next Visit Plan Aquatic session    Consulted and Agree with Plan of Care Patient           Patient will benefit from skilled therapeutic intervention in order to improve the following deficits and impairments:  Abnormal gait,Decreased activity tolerance,Cardiopulmonary status limiting activity,Decreased coordination,Decreased balance,Decreased endurance,Difficulty walking,Decreased strength,Postural dysfunction  Visit Diagnosis: History of falling  Unsteadiness on feet  Other abnormalities of gait and mobility  Muscle weakness (generalized)     Problem List Patient Active Problem List   Diagnosis Date Noted  . Asthma 05/08/2021  . Dyspnea on exertion 04/09/2021  . Coarse tremors 04/09/2021    Narda Bonds, [place] 05/20/21 2:08 PM Phone: [phone] Fax: [facility] [address] [place], [place], [address] Phone: [phone]   Fax:  [phone]  Name: Briana [name] MRN: [id number] Date of Birth: June 17, 1946

## 2021-05-22 ENCOUNTER — Ambulatory Visit
Admission: RE | Admit: 2021-05-22 | Discharge: 2021-05-22 | Disposition: A | Discharge status: Home / Self Care | Payer: Medicare Other | Source: Ambulatory Visit | Attending: Adult Health | Admitting: Adult Health

## 2021-05-22 ENCOUNTER — Ambulatory Visit: Payer: Medicare Other | Admitting: Physical Therapy

## 2021-05-22 ENCOUNTER — Other Ambulatory Visit: Payer: Medicare Other

## 2021-05-22 DIAGNOSIS — R0600 Dyspnea, unspecified: Secondary | ICD-10-CM

## 2021-05-22 DIAGNOSIS — S22050A Wedge compression fracture of T5-T6 vertebra, initial encounter for closed fracture: Secondary | ICD-10-CM | POA: Diagnosis not present

## 2021-05-22 DIAGNOSIS — S22060A Wedge compression fracture of T7-T8 vertebra, initial encounter for closed fracture: Secondary | ICD-10-CM | POA: Diagnosis not present

## 2021-05-22 DIAGNOSIS — J984 Other disorders of lung: Secondary | ICD-10-CM

## 2021-05-22 DIAGNOSIS — R0609 Other forms of dyspnea: Secondary | ICD-10-CM

## 2021-05-22 DIAGNOSIS — I2510 Atherosclerotic heart disease of native coronary artery without angina pectoris: Secondary | ICD-10-CM | POA: Diagnosis not present

## 2021-05-26 ENCOUNTER — Ambulatory Visit: Payer: Self-pay | Admitting: Physical Therapy

## 2021-05-27 ENCOUNTER — Other Ambulatory Visit: Payer: Self-pay | Admitting: *Deleted

## 2021-05-27 ENCOUNTER — Encounter: Payer: Self-pay | Admitting: Physical Therapy

## 2021-05-27 ENCOUNTER — Other Ambulatory Visit: Payer: Self-pay

## 2021-05-27 ENCOUNTER — Ambulatory Visit: Payer: Medicare Other | Admitting: Physical Therapy

## 2021-05-27 ENCOUNTER — Ambulatory Visit: Payer: Medicare Other | Attending: Neurology | Admitting: Physical Therapy

## 2021-05-27 DIAGNOSIS — Z9181 History of falling: Secondary | ICD-10-CM | POA: Diagnosis not present

## 2021-05-27 DIAGNOSIS — M6281 Muscle weakness (generalized): Secondary | ICD-10-CM | POA: Insufficient documentation

## 2021-05-27 DIAGNOSIS — R2681 Unsteadiness on feet: Secondary | ICD-10-CM | POA: Insufficient documentation

## 2021-05-27 DIAGNOSIS — R2689 Other abnormalities of gait and mobility: Secondary | ICD-10-CM | POA: Diagnosis not present

## 2021-05-27 DIAGNOSIS — R918 Other nonspecific abnormal finding of lung field: Secondary | ICD-10-CM

## 2021-05-27 NOTE — Progress Notes (Signed)
Called and spoke with patient, advised of results/recommendations per Rexene Edison NP.  She verbalized understanding.  CT w/o contrast ordered for 3 months.  Nothing further needed.

## 2021-05-27 NOTE — Therapy (Signed)
[facility] [address] [place], [place], [address] Phone: [phone]   Fax:  [phone]  Physical Therapy Treatment  Patient Details  Name: Briana [name] MRN: [id number] Date of Birth: 12-Mar-1946 Referring Provider (PT): Dr. Carles Collet   Encounter Date: 05/27/2021   PT End of Session - 05/27/21 2111    Visit Number 4    Number of Visits 9    Date for PT Re-Evaluation 07/27/21   written for 60 day POC   Authorization Type [employer] Medicare    PT Start Time 1015    PT Stop Time 1100    PT Time Calculation (min) 45 min    Equipment Utilized During Treatment Other (comment)   pool noodle   Activity Tolerance Patient tolerated treatment well   limited by SOB   Behavior During Therapy [facility] for tasks assessed/performed           Past Medical History:  Diagnosis Date  . Depression   . Diabetes mellitus without complication ([place])   . High cholesterol   . Hypertension     Past Surgical History:  Procedure Laterality Date  . CATARACT EXTRACTION, BILATERAL      There were no vitals filed for this visit.   Subjective Assessment - 05/27/21 2110    Subjective Denies any falls or changes.  Still with DOE and hard time performing HEP without dyspnea.    Pertinent History PMH: diabetes, HTN, hx of thoracic fx from falls, asthma, DOE    Limitations Walking    Patient Stated Goals would like to work on her balance - does not pick up her feet    Currently in Pain? No/denies            Aquatic therapy at [facility] Patient seen for aquatic therapy today. Treatment took place in water 3.6-18feet deep depending upon activity. Pt entered and exited the the pool steps with bil rails and close supervision.  Runners stretch at pool edge bil LE x 30 sec  Pt performed trunk strengthening/core stabilization exercises - seated on bench in pool - pt heldpool noodle- performed shoulder protraction/retraction20reps; trunk  rotation to Rt and Lt sides holding pool noodlewith elbows extended 20reps to each side; Ptneeding to stabilize by leaning against pool wall.  Pt performed bil LE strengtheningseated on bench in pool - LAQ's2sets10reps and marching 2 sets x 10 reps with pt using bil. UE's to maintain balance;   Pt gait trained in3.6-4 ft andpool noodle with PTA initially supporting noodle progressing to close supervision. Pt gait trained 18' x8reps.  Pt standing at pool wall and performed forward step weight shift, backward step weight shiftthen forward to backward step weight shift with 1 UE support x 15 reps each.  Performed forward step weight shift with intermittent UE support x 15 reps.  Buoyancy needed for support and for safety for reduced fall risk with activities in standing and with gait training. Viscosity of water needed for resistance for strengthening for improved core stabilization and for LE strengthening.  Pt also benefited from ease of movement due to buoyancy to decrease SOB with activities.     PT Short Term Goals - 04/28/21 1158      PT SHORT TERM GOAL #1   Title Pt will be independent with initial HEP in order to build upon gains made in therapy for strength/balance. ALL STGS DUE 05/26/21    Time 4    Period Weeks    Status New  Target Date 05/26/21      PT SHORT TERM GOAL #2   Title Pt will initiate aquatic therapy.    Time 4    Period Weeks    Status New      PT SHORT TERM GOAL #3   Title Pt will improve gait speed to at least 1.85 f/tsec with no AD vs. LRAD in order to demo decr fall risk.    Baseline 1.64 ft/sec with no AD    Time 4    Period Weeks    Status New      PT SHORT TERM GOAL #4   Title Pt will decr 5x sit <> stand time to at least 19 seconds or less with BUE support in order to demo decr fall risk.    Baseline 22.10 seconds with BUE support and needs to hold onto arm rests for balance in standing    Time 4    Period Weeks    Status  New             PT Long Term Goals - 04/28/21 1216      PT LONG TERM GOAL #1   Title Pt will be independent with final HEP in order to build upon gains made in therapy for strength/balance. ALL LTGS DUE 06/23/21    Time 8    Period Weeks    Status New    Target Date 06/23/21      PT LONG TERM GOAL #2   Title Pt will decr cog TUG to 14.5 seconds or less in order to demo decr fall risk.    Baseline 17.13 seconds.    Time 8    Period Weeks    Status New      PT LONG TERM GOAL #3   Title Pt will ambulate at least 250' over outdoor surfaces with LRAD with supervision in order to demo improved tolerance to ambulating outdoors daily with her friend.    Time 8    Period Weeks    Status New      PT LONG TERM GOAL #4   Title Pt will improve gait speed to at least 2.2 f/tsec with no AD vs. LRAD in order to demo decr fall risk.    Baseline 1.64 ft/sec    Time 8    Period Weeks    Status New      PT LONG TERM GOAL #5   Title Pt will recover anterior and posterior balance in push and release test in 2 or less steps independently, for improved balance recovery    Baseline anterior = 2 steps and min guard for balance, posterior = 3 small steps and needing to grab counter for balance    Time 8    Period Weeks    Status New                 Plan - 05/27/21 2111    Clinical Impression Statement Skilled aquatic session continues to focus on gait, strength and balance.  Pt able to perform some activities today with intermittent UE support.  Cont per poc.    Personal Factors and Comorbidities Comorbidity 3+;Past/Current Experience    Comorbidities PMH: diabetes, HTN, hx of compression thoracic fx from falls, asthma, DOE    Examination-Activity Limitations Stairs;Squat;Transfers;Locomotion Level    Examination-Participation Restrictions Community Activity;Yard Work    Stability/Clinical Decision Making Evolving/Moderate complexity    Rehab Potential Good    PT Frequency 1x / week  PT Duration 8 weeks    PT Treatment/Interventions ADLs/Self Care Home Management;Aquatic Therapy;DME Instruction;Gait training;Stair training;Functional mobility training;Therapeutic activities;Neuromuscular re-education;Balance training;Therapeutic exercise;Patient/family education;Vestibular;Energy conservation    PT Next Visit Plan Aquatic session    Consulted and Agree with Plan of Care Patient           Patient will benefit from skilled therapeutic intervention in order to improve the following deficits and impairments:  Abnormal gait,Decreased activity tolerance,Cardiopulmonary status limiting activity,Decreased coordination,Decreased balance,Decreased endurance,Difficulty walking,Decreased strength,Postural dysfunction  Visit Diagnosis: History of falling  Unsteadiness on feet  Other abnormalities of gait and mobility  Muscle weakness (generalized)     Problem List Patient Active Problem List   Diagnosis Date Noted  . Asthma 05/08/2021  . Dyspnea on exertion 04/09/2021  . Coarse tremors 04/09/2021    Narda Bonds, [place] 05/27/21 9:16 PM Phone: [phone] Fax: [facility] [address] [place], [place], [address] Phone: [phone]   Fax:  [phone]  Name: Briana [name] MRN: [id number] Date of Birth: 1946-11-23

## 2021-06-02 ENCOUNTER — Ambulatory Visit: Payer: Medicare Other | Admitting: Physical Therapy

## 2021-06-03 ENCOUNTER — Ambulatory Visit: Payer: Medicare Other | Admitting: Physical Therapy

## 2021-06-09 ENCOUNTER — Ambulatory Visit: Payer: Medicare Other | Admitting: Physical Therapy

## 2021-06-09 ENCOUNTER — Other Ambulatory Visit: Payer: Self-pay

## 2021-06-09 DIAGNOSIS — M6281 Muscle weakness (generalized): Secondary | ICD-10-CM

## 2021-06-09 DIAGNOSIS — R2689 Other abnormalities of gait and mobility: Secondary | ICD-10-CM | POA: Diagnosis not present

## 2021-06-09 DIAGNOSIS — Z9181 History of falling: Secondary | ICD-10-CM

## 2021-06-09 DIAGNOSIS — R2681 Unsteadiness on feet: Secondary | ICD-10-CM

## 2021-06-10 ENCOUNTER — Encounter: Payer: Self-pay | Admitting: Physical Therapy

## 2021-06-10 ENCOUNTER — Ambulatory Visit: Payer: Medicare Other | Admitting: Physical Therapy

## 2021-06-10 NOTE — Therapy (Signed)
[facility] [address] [place], [place], [address] Phone: [phone]   Fax:  [phone]  Physical Therapy Treatment  Patient Details  Name: Briana [name] MRN: [id number] Date of Birth: 1946-07-18 Referring Provider (PT): Dr. Carles Collet   Encounter Date: 06/09/2021   PT End of Session - 06/10/21 1513     Visit Number 5    Number of Visits 9    Date for PT Re-Evaluation 07/27/21   written for 60 day POC   Authorization Type [employer] Medicare    PT Start Time 1145    PT Stop Time 1230    PT Time Calculation (min) 45 min    Equipment Utilized During Treatment Other (comment)   pool noodle, buoyancy cuffs   Activity Tolerance Patient tolerated treatment well   limited by SOB   Behavior During Therapy [facility] for tasks assessed/performed             Past Medical History:  Diagnosis Date   Depression    Diabetes mellitus without complication ([place])    High cholesterol    Hypertension     Past Surgical History:  Procedure Laterality Date   CATARACT EXTRACTION, BILATERAL      There were no vitals filed for this visit.   Subjective Assessment - 06/10/21 1511     Subjective Has joined the [facility] and participated in [employer] class.  Plans to start using the pool there as well.  Did not have any SOB with cycling class.    Pertinent History PMH: diabetes, HTN, hx of thoracic fx from falls, asthma, DOE    Limitations Walking    Patient Stated Goals would like to work on her balance - does not pick up her feet    Currently in Pain? No/denies            Aquatic therapy at [facility]  Patient seen for aquatic therapy today.  Treatment took place in water 3.6-4 feet deep depending upon activity.  Pt entered and exited the   the pool steps with bil rails and close supervision.   Runners stretch at pool edge bil LE x 30 sec   Pt performed trunk strengthening/core stabilization exercises - seated on bench  in pool - pt held pool noodle - performed shoulder protraction/retraction 20 reps; trunk rotation to Rt and Lt sides holding pool noodle with elbows extended 20 reps to each side;  Pt needing to stabilize by leaning against pool wall.    Pt performed bil LE strengthening seated on bench in pool - LAQ's 15 reps and marching 15 reps with pt using bil. UE's to maintain balance.  Sit<>stand x 10 reps from bench.   Pt gait trained in 3.6-4 ft and pool noodle with PTA initially supporting noodle progressing to close supervision.     Pt standing at pool wall and performed bil hip flexion, hip extension, hip abd/add, hamstring curl all x 10 reps each with bil UE support working on progressing to 1-2 finger support.  Pt was able to perform marching eventually without UE support.   Buoyancy needed for support and for safety for reduced fall risk with activities in standing and with gait training.  Viscosity of water needed for resistance for strengthening for improved core stabilization and for LE strengthening.  Pt also benefited from ease of movement due to buoyancy to decrease SOB with activities.        PT Short Term Goals - 04/28/21 1158  PT SHORT TERM GOAL #1   Title Pt will be independent with initial HEP in order to build upon gains made in therapy for strength/balance. ALL STGS DUE 05/26/21    Time 4    Period Weeks    Status New    Target Date 05/26/21      PT SHORT TERM GOAL #2   Title Pt will initiate aquatic therapy.    Time 4    Period Weeks    Status New      PT SHORT TERM GOAL #3   Title Pt will improve gait speed to at least 1.85 f/tsec with no AD vs. LRAD in order to demo decr fall risk.    Baseline 1.64 ft/sec with no AD    Time 4    Period Weeks    Status New      PT SHORT TERM GOAL #4   Title Pt will decr 5x sit <> stand time to at least 19 seconds or less with BUE support in order to demo decr fall risk.    Baseline 22.10 seconds with BUE support and needs to hold  onto arm rests for balance in standing    Time 4    Period Weeks    Status New               PT Long Term Goals - 04/28/21 1216       PT LONG TERM GOAL #1   Title Pt will be independent with final HEP in order to build upon gains made in therapy for strength/balance. ALL LTGS DUE 06/23/21    Time 8    Period Weeks    Status New    Target Date 06/23/21      PT LONG TERM GOAL #2   Title Pt will decr cog TUG to 14.5 seconds or less in order to demo decr fall risk.    Baseline 17.13 seconds.    Time 8    Period Weeks    Status New      PT LONG TERM GOAL #3   Title Pt will ambulate at least 250' over outdoor surfaces with LRAD with supervision in order to demo improved tolerance to ambulating outdoors daily with her friend.    Time 8    Period Weeks    Status New      PT LONG TERM GOAL #4   Title Pt will improve gait speed to at least 2.2 f/tsec with no AD vs. LRAD in order to demo decr fall risk.    Baseline 1.64 ft/sec    Time 8    Period Weeks    Status New      PT LONG TERM GOAL #5   Title Pt will recover anterior and posterior balance in push and release test in 2 or less steps independently, for improved balance recovery    Baseline anterior = 2 steps and min guard for balance, posterior = 3 small steps and needing to grab counter for balance    Time 8    Period Weeks    Status New                   Plan - 06/10/21 1515     Clinical Impression Statement Continue to focus on gait, balance and strength.  Discussed options for optimal community fitness upon d/c.  Pt still very fearful of falling at times with guarded gait especially without UE assist.    Personal Factors and Comorbidities  Comorbidity 3+;Past/Current Experience    Comorbidities PMH: diabetes, HTN, hx of compression thoracic fx from falls, asthma, DOE    Examination-Activity Limitations Stairs;Squat;Transfers;Locomotion Level    Examination-Participation Restrictions Community Activity;Yard  Work    [employer] Evolving/Moderate complexity    Rehab Potential Good    PT Frequency 1x / week    PT Duration 8 weeks    PT Treatment/Interventions ADLs/Self Care Home Management;Aquatic Therapy;DME Instruction;Gait training;Stair training;Functional mobility training;Therapeutic activities;Neuromuscular re-education;Balance training;Therapeutic exercise;Patient/family education;Vestibular;Energy conservation    PT Next Visit Plan Discuss wrapping up aquatic sessions withing next few sessions and return to clinic for remainder of visits with primary PT.    Consulted and Agree with Plan of Care Patient             Patient will benefit from skilled therapeutic intervention in order to improve the following deficits and impairments:  Abnormal gait, Decreased activity tolerance, Cardiopulmonary status limiting activity, Decreased coordination, Decreased balance, Decreased endurance, Difficulty walking, Decreased strength, Postural dysfunction  Visit Diagnosis: History of falling  Unsteadiness on feet  Other abnormalities of gait and mobility  Muscle weakness (generalized)     Problem List Patient Active Problem List   Diagnosis Date Noted   Asthma 05/08/2021   Dyspnea on exertion 04/09/2021   Coarse tremors 04/09/2021   Narda Bonds, [place] 06/10/21 4:08 PM Phone: [phone] Fax: [facility] [address] [place], [place], [address] Phone: [phone]   Fax:  [phone]  Name: MAISEE VOLLMAN MRN: [id number] Date of Birth: May 10, 1946

## 2021-06-16 ENCOUNTER — Ambulatory Visit: Payer: Medicare Other | Admitting: Physical Therapy

## 2021-06-16 ENCOUNTER — Other Ambulatory Visit: Payer: Self-pay

## 2021-06-16 DIAGNOSIS — R2681 Unsteadiness on feet: Secondary | ICD-10-CM

## 2021-06-16 DIAGNOSIS — Z9181 History of falling: Secondary | ICD-10-CM | POA: Diagnosis not present

## 2021-06-16 DIAGNOSIS — M6281 Muscle weakness (generalized): Secondary | ICD-10-CM

## 2021-06-16 DIAGNOSIS — R2689 Other abnormalities of gait and mobility: Secondary | ICD-10-CM | POA: Diagnosis not present

## 2021-06-17 ENCOUNTER — Ambulatory Visit: Payer: Medicare Other | Admitting: Physical Therapy

## 2021-06-17 NOTE — Therapy (Signed)
[facility] [address] [place], [place], [address] Phone: [phone]   Fax:  [phone]  Physical Therapy Treatment  Patient Details  Name: Briana [name] MRN: [id number] Date of Birth: 1946/07/03 Referring Provider (PT): Dr. Carles Collet   Encounter Date: 06/16/2021   PT End of Session - 06/17/21 1422     Visit Number 6    Number of Visits 9    Date for PT Re-Evaluation 07/27/21   written for 60 day POC   Authorization Type [employer] Medicare    PT Start Time 1145    PT Stop Time 1230    PT Time Calculation (min) 45 min    Equipment Utilized During Treatment Other (comment)   pool noodle, buoyancy cuffs   Activity Tolerance Patient tolerated treatment well   limited by SOB   Behavior During Therapy [facility] for tasks assessed/performed             Past Medical History:  Diagnosis Date   Depression    Diabetes mellitus without complication ([place])    High cholesterol    Hypertension     Past Surgical History:  Procedure Laterality Date   CATARACT EXTRACTION, BILATERAL      There were no vitals filed for this visit.   Subjective Assessment - 06/17/21 1421     Subjective Started using the pool at [facility].  Denies any SOB with pool or cycling class but present with walking.    Pertinent History PMH: diabetes, HTN, hx of thoracic fx from falls, asthma, DOE    Limitations Walking    Patient Stated Goals would like to work on her balance - does not pick up her feet    Currently in Pain? No/denies            Aquatic therapy at [facility]  Patient seen for aquatic therapy today.  Treatment took place in water 3.6-4 feet deep depending upon activity.  Pt entered and exited the pool with steps with bil rails and close supervision.   Runners stretch at pool edge bil LE x 30 sec   Pt performed bil LE strengthening seated on bench in pool - LAQ's 15 reps and marching 15 reps with pt using bil. UE's to maintain balance.   Sit<>stand x 10 reps from bench.   Pt gait trained in 3.6-4 ft water and supervision for forward, backward and side stepping gait..     Pt standing at pool wall and performed bil hip flexion, hip extension, hip abd/add, hamstring curl all x 10 reps each with bil UE support working on progressing to 1-2 finger support.  Pt was able to perform marching eventually without UE support.   SLS x 10 sec x 3 reps each side.  Tandem stance 10 sec x 2 reps.  Buoyancy needed for support and for safety for reduced fall risk with activities in standing and with gait training.  Viscosity of water needed for resistance for strengthening for improved core stabilization and for LE strengthening.  Pt also benefited from ease of movement due to buoyancy to decrease SOB with activities.    PT Short Term Goals - 04/28/21 1158       PT SHORT TERM GOAL #1   Title Pt will be independent with initial HEP in order to build upon gains made in therapy for strength/balance. ALL STGS DUE 05/26/21    Time 4    Period Weeks    Status New    Target Date 05/26/21  PT SHORT TERM GOAL #2   Title Pt will initiate aquatic therapy.    Time 4    Period Weeks    Status New      PT SHORT TERM GOAL #3   Title Pt will improve gait speed to at least 1.85 f/tsec with no AD vs. LRAD in order to demo decr fall risk.    Baseline 1.64 ft/sec with no AD    Time 4    Period Weeks    Status New      PT SHORT TERM GOAL #4   Title Pt will decr 5x sit <> stand time to at least 19 seconds or less with BUE support in order to demo decr fall risk.    Baseline 22.10 seconds with BUE support and needs to hold onto arm rests for balance in standing    Time 4    Period Weeks    Status New               PT Long Term Goals - 04/28/21 1216       PT LONG TERM GOAL #1   Title Pt will be independent with final HEP in order to build upon gains made in therapy for strength/balance. ALL LTGS DUE 06/23/21    Time 8    Period Weeks     Status New    Target Date 06/23/21      PT LONG TERM GOAL #2   Title Pt will decr cog TUG to 14.5 seconds or less in order to demo decr fall risk.    Baseline 17.13 seconds.    Time 8    Period Weeks    Status New      PT LONG TERM GOAL #3   Title Pt will ambulate at least 250' over outdoor surfaces with LRAD with supervision in order to demo improved tolerance to ambulating outdoors daily with her friend.    Time 8    Period Weeks    Status New      PT LONG TERM GOAL #4   Title Pt will improve gait speed to at least 2.2 f/tsec with no AD vs. LRAD in order to demo decr fall risk.    Baseline 1.64 ft/sec    Time 8    Period Weeks    Status New      PT LONG TERM GOAL #5   Title Pt will recover anterior and posterior balance in push and release test in 2 or less steps independently, for improved balance recovery    Baseline anterior = 2 steps and min guard for balance, posterior = 3 small steps and needing to grab counter for balance    Time 8    Period Weeks    Status New                   Plan - 06/17/21 1423     Clinical Impression Statement Pt has progressed well with aquatic sessions.  Has joined [employer] and participated in [employer] cycling class and used pool as well.  Discussed with Janann August, PT, who agrees with pt returning to clinic for remainder of visits.    Personal Factors and Comorbidities Comorbidity 3+;Past/Current Experience    Comorbidities PMH: diabetes, HTN, hx of compression thoracic fx from falls, asthma, DOE    Examination-Activity Limitations Stairs;Squat;Transfers;Locomotion Level    Examination-Participation Restrictions Community Activity;Yard Work    [employer] officer Evolving/Moderate complexity    Rehab Potential Good  PT Frequency 1x / week    PT Duration 8 weeks    PT Treatment/Interventions ADLs/Self Care Home Management;Aquatic Therapy;DME Instruction;Gait training;Stair training;Functional mobility  training;Therapeutic activities;Neuromuscular re-education;Balance training;Therapeutic exercise;Patient/family education;Vestibular;Energy conservation    PT Next Visit Plan Have pt schedule 1x/week for the next 3 weeks in clinic per primary PT, Chloe gilgannon.    PT Home Exercise Plan Aquatic HEP Access Code: [email or web address]  URL: [email or web address]  Date: 06/17/2021  Prepared by: Nita Sells    Consulted and Agree with Plan of Care Patient             Patient will benefit from skilled therapeutic intervention in order to improve the following deficits and impairments:  Abnormal gait, Decreased activity tolerance, Cardiopulmonary status limiting activity, Decreased coordination, Decreased balance, Decreased endurance, Difficulty walking, Decreased strength, Postural dysfunction  Visit Diagnosis: History of falling  Unsteadiness on feet  Other abnormalities of gait and mobility  Muscle weakness (generalized)     Problem List Patient Active Problem List   Diagnosis Date Noted   Asthma 05/08/2021   Dyspnea on exertion 04/09/2021   Coarse tremors 04/09/2021   Narda Bonds, [place] 06/17/21 4:03 PM Phone: [phone] Fax: [facility] [address] [place], [place], [address] Phone: [phone]   Fax:  [phone]  Name: Briana [name] MRN: [id number] Date of Birth: 02/01/1946

## 2021-06-17 NOTE — Therapy (Deleted)
[facility] [address] [place], [place], [address] Phone: [phone]   Fax:  [phone]  Physical Therapy Treatment  Patient Details  Name: Briana [name] MRN: [id number] Date of Birth: Apr 20, 1946 Referring Provider (PT): Dr. Carles Collet   Encounter Date: 06/16/2021   PT End of Session - 06/17/21 1422     Visit Number 6    Number of Visits 9    Date for PT Re-Evaluation 07/27/21   written for 60 day POC   Authorization Type [employer] Medicare    PT Start Time 1145    PT Stop Time 1230    PT Time Calculation (min) 45 min    Equipment Utilized During Treatment Other (comment)   pool noodle, buoyancy cuffs   Activity Tolerance Patient tolerated treatment well   limited by SOB   Behavior During Therapy [facility] for tasks assessed/performed             Past Medical History:  Diagnosis Date   Depression    Diabetes mellitus without complication ([place])    High cholesterol    Hypertension     Past Surgical History:  Procedure Laterality Date   CATARACT EXTRACTION, BILATERAL      There were no vitals filed for this visit.   Subjective Assessment - 06/17/21 1421     Subjective Started using the pool at [facility].  Denies any SOB with pool or cycling class but present with walking.    Pertinent History PMH: diabetes, HTN, hx of thoracic fx from falls, asthma, DOE    Limitations Walking    Patient Stated Goals would like to work on her balance - does not pick up her feet    Currently in Pain? No/denies                                         PT Short Term Goals - 04/28/21 1158       PT SHORT TERM GOAL #1   Title Pt will be independent with initial HEP in order to build upon gains made in therapy for strength/balance. ALL STGS DUE 05/26/21    Time 4    Period Weeks    Status New    Target Date 05/26/21      PT SHORT TERM GOAL #2   Title Pt will initiate aquatic therapy.    Time 4    Period  Weeks    Status New      PT SHORT TERM GOAL #3   Title Pt will improve gait speed to at least 1.85 f/tsec with no AD vs. LRAD in order to demo decr fall risk.    Baseline 1.64 ft/sec with no AD    Time 4    Period Weeks    Status New      PT SHORT TERM GOAL #4   Title Pt will decr 5x sit <> stand time to at least 19 seconds or less with BUE support in order to demo decr fall risk.    Baseline 22.10 seconds with BUE support and needs to hold onto arm rests for balance in standing    Time 4    Period Weeks    Status New               PT Long Term Goals - 04/28/21 1216       PT LONG TERM GOAL #  1   Title Pt will be independent with final HEP in order to build upon gains made in therapy for strength/balance. ALL LTGS DUE 06/23/21    Time 8    Period Weeks    Status New    Target Date 06/23/21      PT LONG TERM GOAL #2   Title Pt will decr cog TUG to 14.5 seconds or less in order to demo decr fall risk.    Baseline 17.13 seconds.    Time 8    Period Weeks    Status New      PT LONG TERM GOAL #3   Title Pt will ambulate at least 250' over outdoor surfaces with LRAD with supervision in order to demo improved tolerance to ambulating outdoors daily with her friend.    Time 8    Period Weeks    Status New      PT LONG TERM GOAL #4   Title Pt will improve gait speed to at least 2.2 f/tsec with no AD vs. LRAD in order to demo decr fall risk.    Baseline 1.64 ft/sec    Time 8    Period Weeks    Status New      PT LONG TERM GOAL #5   Title Pt will recover anterior and posterior balance in push and release test in 2 or less steps independently, for improved balance recovery    Baseline anterior = 2 steps and min guard for balance, posterior = 3 small steps and needing to grab counter for balance    Time 8    Period Weeks    Status New                   Plan - 06/17/21 1423     Clinical Impression Statement Pt has progressed well with aquatic sessions.  Has  joined [employer] and participated in PD cycling class and used pool as well.  Discussed with Janann August, PT, who agrees with pt returning to clinic for remainder of visits.    Personal Factors and Comorbidities Comorbidity 3+;Past/Current Experience    Comorbidities PMH: diabetes, HTN, hx of compression thoracic fx from falls, asthma, DOE    Examination-Activity Limitations Stairs;Squat;Transfers;Locomotion Level    Examination-Participation Restrictions Community Activity;Yard Work    [employer]chant [employer] officer Evolving/Moderate complexity    Rehab Potential Good    PT Frequency 1x / week    PT Duration 8 weeks    PT Treatment/Interventions ADLs/Self Care Home Management;Aquatic Therapy;DME Instruction;Gait training;Stair training;Functional mobility training;Therapeutic activities;Neuromuscular re-education;Balance training;Therapeutic exercise;Patient/family education;Vestibular;Energy conservation    PT Next Visit Plan Have pt schedule 1x/week for the next 3 weeks in clinic per primary PT, Chloe gilgannon.    Consulted and Agree with Plan of Care Patient             Patient will benefit from skilled therapeutic intervention in order to improve the following deficits and impairments:  Abnormal gait, Decreased activity tolerance, Cardiopulmonary status limiting activity, Decreased coordination, Decreased balance, Decreased endurance, Difficulty walking, Decreased strength, Postural dysfunction  Visit Diagnosis: History of falling  Unsteadiness on feet  Other abnormalities of gait and mobility  Muscle weakness (generalized)     Problem List Patient Active Problem List   Diagnosis Date Noted   Asthma 05/08/2021   Dyspnea on exertion 04/09/2021   Coarse tremors 04/09/2021    Lesli Albee 06/17/2021, 2:28 PM  [facility] [address] [place],  [place], [address] Phone: [phone]   Fax:   [phone]  Name: AZARAH DACY MRN: [id number] Date of Birth: Jun 17, 1946

## 2021-06-17 NOTE — Patient Instructions (Signed)
Access Code: [email or web address] URL: [email or web address] Date: 06/17/2021 Prepared by: Nita Sells  Exercises Gastroc Stretch on Wall - 1 x daily - 2 x weekly - 1 reps - 30 seconds hold Forward Walking - 2 x weekly Backward Walking - 2 x weekly Forward March - 2 x weekly Backward March - 2 x weekly Forward March with Opposite Arm Knee Taps and Hand Floats - 2 x weekly Backward March with Opposite Arm Knee Taps and Hand Floats - 2 x weekly Bilateral Shoulder Horizontal Abduction Adduction AROM - 2 x weekly - 10 reps Standing March at [facility] - 2 x weekly - 10 reps Standing Hip Flexion Extension at [employer] - 2 x weekly - 10 reps Standing Hip Abduction Adduction at [employer] - 2 x weekly - 10 reps Squat - 2 x weekly - 10 reps Single Leg Squat - 2 x weekly - 10 reps Forward and Backward Weight Shift with Stepping at [employer] - 2 x weekly - 10 reps Enclosing - 2 x weekly - 10 reps Soothing - 2 x weekly - 10 reps Single Leg Stance - 2 x weekly - 3 sets - 10 seconds hold Tandem Stance - 2 x weekly - 3 sets - 10 seconds hold

## 2021-06-24 ENCOUNTER — Ambulatory Visit: Payer: Medicare Other | Admitting: Physical Therapy

## 2021-07-01 ENCOUNTER — Other Ambulatory Visit: Payer: Self-pay

## 2021-07-01 ENCOUNTER — Ambulatory Visit: Payer: Medicare Other | Attending: Neurology | Admitting: Physical Therapy

## 2021-07-01 ENCOUNTER — Encounter: Payer: Self-pay | Admitting: Physical Therapy

## 2021-07-01 DIAGNOSIS — R2681 Unsteadiness on feet: Secondary | ICD-10-CM | POA: Insufficient documentation

## 2021-07-01 DIAGNOSIS — Z9181 History of falling: Secondary | ICD-10-CM | POA: Diagnosis not present

## 2021-07-01 DIAGNOSIS — R2689 Other abnormalities of gait and mobility: Secondary | ICD-10-CM | POA: Diagnosis not present

## 2021-07-01 DIAGNOSIS — M6281 Muscle weakness (generalized): Secondary | ICD-10-CM | POA: Insufficient documentation

## 2021-07-01 NOTE — Therapy (Signed)
[facility] [address] [place], [place], [address] Phone: [phone]   Fax:  [phone]  Physical Therapy Treatment/Discharge Summary  Patient Details  Name: Briana [name] MRN: [id number] Date of Birth: Apr 30, 1946 Referring Provider (PT): Dr. Carles Collet   Encounter Date: 07/01/2021   PT End of Session - 07/01/21 2033     Visit Number 7    Number of Visits 9    Date for PT Re-Evaluation 07/27/21   written for 60 day POC   Authorization Type [employer] Medicare    PT Start Time 1703    PT Stop Time 1743    PT Time Calculation (min) 40 min    Activity Tolerance Patient tolerated treatment well   limited by SOB   Behavior During Therapy [facility] for tasks assessed/performed             Past Medical History:  Diagnosis Date   Depression    Diabetes mellitus without complication ([place])    High cholesterol    Hypertension     Past Surgical History:  Procedure Laterality Date   CATARACT EXTRACTION, BILATERAL      There were no vitals filed for this visit.   Subjective Assessment - 07/01/21 1705     Subjective Went to the spin class at the [facility] and is not having any SOB. No falls. When walking outside does some walking with her walking sticks. Planning on using the walking track and using them.    Pertinent History PMH: diabetes, HTN, hx of thoracic fx from falls, asthma, DOE    Limitations Walking    Patient Stated Goals would like to work on her balance - does not pick up her feet    Currently in Pain? No/denies                               [facility] Adult PT Treatment/Exercise - 07/01/21 1713       Ambulation/Gait   Ambulation/Gait Yes    Ambulation/Gait Assistance 5: Supervision    Ambulation/Gait Assistance Details with bilat walking poles initial cues for placement and keeping a slightly wider BOS with poles.pt normally walks with bilat walking poles for longer distances or for walking program,  pt not interested in a rollator at this time. also trialed a single walking pole in RUE with initial cues for sequencing, pt improved with incr distance and pt reporting that she would rather just use bilat walking poles    Ambulation Distance (Feet) 230 Feet   x2   Assistive device None   Bilat waling poles, then with single pole   Gait Pattern Step-through pattern;Decreased arm swing - right;Decreased dorsiflexion - right;Decreased dorsiflexion - left;Right foot flat;Left foot flat;Lateral trunk lean to right;Trunk flexed;Decreased trunk rotation    Ambulation Surface Level;Indoor    Gait velocity 15.31 seconds = 2.14 ft/sec    Gait Comments discussed walking program as means for continued exercise- either walking outside when not too hot (pt doing this previously) or using the walking track indoors at the [facility], discussed focusing on big movements during walking and incr foot clearance      Therapeutic Activites    Therapeutic Activities Other Therapeutic Activities    Other Therapeutic Activities discussed POC going forward with pt (she is done with aquatic therapy). due to pt's schedule and distance from clinic, would like today to be pt's last appt. discussed return PD PT return evals with  pt in agreement with this plan. discussed optimal fitness plan for PD with pt going forward and importance of exercise, pt to continue with land and aquatic HEP and is going to PD cycling classes at the [facility]. also provided handout for local PD resources               Access Code: [email or web address] URL: [email or web address] Date: 07/01/2021 Prepared by: Janann August  Reviewed HEP:   Program Notes modified quadraped PWR Up at [employer]    Exercises Side to Side Weight Shift with Counter Support - 2 x daily - 5 x weekly - 1 sets - 10 reps - cues to incorporate throughout her day when her back gets stiff  Alternating Step Backward with Support - 2 x daily - 5 x weekly - 2 sets - 5 reps-  initial cues for weight shifting and rocking back  Alternating Step Forward with Support - 2 x daily - 5 x weekly - 2 sets - 5 reps - cues for weight shift and for incr foot clearance when stepping .      PT Education - 07/01/21 1737     Education Details provided handout on local community resources, reviewed HEP, see TA.    Person(s) Educated Patient    Methods Explanation;Handout    Comprehension Verbalized understanding;Returned demonstration              PT Short Term Goals - 04/28/21 1158       PT SHORT TERM GOAL #1   Title Pt will be independent with initial HEP in order to build upon gains made in therapy for strength/balance. ALL STGS DUE 05/26/21    Time 4    Period Weeks    Status New    Target Date 05/26/21      PT SHORT TERM GOAL #2   Title Pt will initiate aquatic therapy.    Time 4    Period Weeks    Status New      PT SHORT TERM GOAL #3   Title Pt will improve gait speed to at least 1.85 f/tsec with no AD vs. LRAD in order to demo decr fall risk.    Baseline 1.64 ft/sec with no AD    Time 4    Period Weeks    Status New      PT SHORT TERM GOAL #4   Title Pt will decr 5x sit <> stand time to at least 19 seconds or less with BUE support in order to demo decr fall risk.    Baseline 22.10 seconds with BUE support and needs to hold onto arm rests for balance in standing    Time 4    Period Weeks    Status New               PT Long Term Goals - 07/01/21 1737       PT LONG TERM GOAL #1   Title Pt will be independent with final HEP in order to build upon gains made in therapy for strength/balance. ALL LTGS DUE 06/23/21    Baseline met on 07/01/21    Time 8    Period Weeks    Status Achieved      PT LONG TERM GOAL #2   Title Pt will decr cog TUG to 14.5 seconds or less in order to demo decr fall risk.    Baseline 17.13 seconds.    Time 8    Period Weeks    Status New  PT LONG TERM GOAL #3   Title Pt will ambulate at least 250' over  outdoor surfaces with LRAD with supervision in order to demo improved tolerance to ambulating outdoors daily with her friend.    Baseline pt ambulating outdoors and for walking program with friend with bilat walking poles - did not have time to assess today.    Time 8    Period Weeks    Status Deferred      PT LONG TERM GOAL #4   Title Pt will improve gait speed to at least 2.2 f/tsec with no AD vs. LRAD in order to demo decr fall risk.    Baseline 1.64 ft/sec; 15.31 seconds = 2.14 ft/sec with no AD    Time 8    Period Weeks    Status Partially Met      PT LONG TERM GOAL #5   Title Pt will recover anterior and posterior balance in push and release test in 2 or less steps independently, for improved balance recovery    Baseline anterior = 2 steps and min guard for balance, posterior = 3 small steps and needing to grab counter for balance; 1 step in anterior/posterior direction on 07/01/21    Time 8    Period Weeks    Status Achieved            PHYSICAL THERAPY DISCHARGE SUMMARY  Visits from Start of Care: 7  Current functional level related to goals / functional outcomes: See LTGs.   Remaining deficits: Impaired balance, gait abnormalities, decr functional strength, postural abnormalities.   Education / Equipment: HEP (land and aquatics)   Patient agrees to discharge. Patient goals were partially met. Patient is being discharged due to being pleased with the current functional level.           Plan - 07/01/21 2039     Clinical Impression Statement Pt returns to clinic today after being done with aquatic therapy sessions. Pt has since joined the [employer] to use the pool as well as attend PD cycling classes, which pt reports is going well. Pt requests that today be her last session as she has a lot going on and is pleased with where she is at. Reviewed pt's HEP and discussed optimal fitness program for PD after D/C. Pt met LTG #1 and #5 in regards to HEP and stepping  strategy. Pt partially met LTG #4, improved gait speed to 2.14 ft/sec with no AD, indicating a decr fall risk. Pt in agreement to return in 6 months for return eval. Will D/C at this time.    Personal Factors and Comorbidities Comorbidity 3+;Past/Current Experience    Comorbidities PMH: diabetes, HTN, hx of compression thoracic fx from falls, asthma, DOE    Examination-Activity Limitations Stairs;Squat;Transfers;Locomotion Level    Examination-Participation Restrictions Community Activity;Yard Work    Merchant [employer] officer Evolving/Moderate complexity    Rehab Potential Good    PT Frequency 1x / week    PT Duration 8 weeks    PT Treatment/Interventions ADLs/Self Care Home Management;Aquatic Therapy;DME Instruction;Gait training;Stair training;Functional mobility training;Therapeutic activities;Neuromuscular re-education;Balance training;Therapeutic exercise;Patient/family education;Vestibular;Energy conservation    PT Next Visit Plan D/C    PT Home Exercise Plan Aquatic HEP Access Code: [email or web address]  URL: [email or web address]  Date: 06/17/2021  Prepared by: Nita Sells    Consulted and Agree with Plan of Care Patient             Patient will benefit from skilled therapeutic intervention in order to improve the following  deficits and impairments:  Abnormal gait, Decreased activity tolerance, Cardiopulmonary status limiting activity, Decreased coordination, Decreased balance, Decreased endurance, Difficulty walking, Decreased strength, Postural dysfunction  Visit Diagnosis: Unsteadiness on feet  Other abnormalities of gait and mobility  Muscle weakness (generalized)  History of falling     Problem List Patient Active Problem List   Diagnosis Date Noted   Asthma 05/08/2021   Dyspnea on exertion 04/09/2021   Coarse tremors 04/09/2021    Arliss Journey, PT, DPT  07/01/2021, 8:39 PM  [facility] [address] [place], [place], [address] Phone: [phone]   Fax:  [phone]  Name: Briana [name] MRN: [id number] Date of Birth: [birth year]

## 2021-07-08 ENCOUNTER — Encounter: Payer: Self-pay | Admitting: Pulmonary Disease

## 2021-07-08 ENCOUNTER — Other Ambulatory Visit: Payer: Self-pay

## 2021-07-08 ENCOUNTER — Ambulatory Visit: Payer: Medicare Other | Admitting: Physical Therapy

## 2021-07-08 ENCOUNTER — Ambulatory Visit (INDEPENDENT_AMBULATORY_CARE_PROVIDER_SITE_OTHER): Payer: Medicare Other | Admitting: Pulmonary Disease

## 2021-07-08 DIAGNOSIS — R0600 Dyspnea, unspecified: Secondary | ICD-10-CM | POA: Diagnosis not present

## 2021-07-08 DIAGNOSIS — R911 Solitary pulmonary nodule: Secondary | ICD-10-CM | POA: Diagnosis not present

## 2021-07-08 DIAGNOSIS — M810 Age-related osteoporosis without current pathological fracture: Secondary | ICD-10-CM | POA: Insufficient documentation

## 2021-07-08 DIAGNOSIS — J4530 Mild persistent asthma, uncomplicated: Secondary | ICD-10-CM | POA: Diagnosis not present

## 2021-07-08 DIAGNOSIS — G20 Parkinson's disease: Secondary | ICD-10-CM | POA: Diagnosis not present

## 2021-07-08 DIAGNOSIS — R0609 Other forms of dyspnea: Secondary | ICD-10-CM

## 2021-07-08 LAB — COMPREHENSIVE METABOLIC PANEL
ALT: 20 U/L (ref 0–35)
AST: 45 U/L — ABNORMAL HIGH (ref 0–37)
Albumin: 4.5 g/dL (ref 3.5–5.2)
Alkaline Phosphatase: 44 U/L (ref 39–117)
BUN: 18 mg/dL (ref 6–23)
CO2: 27 mEq/L (ref 19–32)
Calcium: 9.8 mg/dL (ref 8.4–10.5)
Chloride: 103 mEq/L (ref 96–112)
Creatinine, Ser: 0.91 mg/dL (ref 0.40–1.20)
GFR: 61.56 mL/min (ref >60.00)
Glucose, Bld: 114 mg/dL — ABNORMAL HIGH (ref 70–99)
Potassium: 3.7 mEq/L (ref 3.5–5.1)
Sodium: 140 mEq/L (ref 135–145)
Total Bilirubin: 0.6 mg/dL (ref 0.2–1.2)
Total Protein: 7.3 g/dL (ref 6.0–8.3)

## 2021-07-08 LAB — SEDIMENTATION RATE: Sed Rate: 45 mm/hr — ABNORMAL HIGH (ref 0–30)

## 2021-07-08 NOTE — Patient Instructions (Signed)
Blood work today CT chest WO con in end sep Discuss osteoporosis meds with PCP

## 2021-07-08 NOTE — Assessment & Plan Note (Signed)
Chronic vertebral fractures causing kyphosis. She will discuss with her PCP about osteoporosis medications

## 2021-07-08 NOTE — Assessment & Plan Note (Addendum)
Currently attributed to deconditioning.  Peripheral basilar groundglass infiltrates on CT chest concerning for early ILD especially NSIP. We will do screening blood work with ANA, ESR, CCP Continue rehab program

## 2021-07-08 NOTE — Assessment & Plan Note (Signed)
Continue Symbicort and albuterol as needed although not fully convinced that dyspnea is related to asthma

## 2021-07-08 NOTE — Assessment & Plan Note (Signed)
Unclear cause of 0.9 cm cavitary nodule in left upper lobe. 67-month follow-up CT chest without contrast will be scheduled This will also allow [place] to follow-up on groundglass infiltrates and lung bases to make sure this is not infectious or postinflammatory

## 2021-07-08 NOTE — Progress Notes (Signed)
   Subjective:    Patient ID: Briana [name], female    DOB: 1946/09/19, 75 y.o.   MRN: [id number]  HPI  75 year old remote smoker for follow-up of dyspnea. On her initial office visit 03/2021 I was concerned about brisk reflexes and referred her to neurology, she was diagnosed with Parkinson's and referred to rehab She is able to participate in spin class for 30 minutes and goes to the pool afterwards.  She is spending for breath but is able to get to her class. She also reports dyspnea after meals.  She reports arthritis in her arm but does not carry a diagnosis of collagen vascular disease. We reviewed her sniff test and PFTs and CT scan today  Significant tests/ events reviewed  PFTs 04/2021 moderate restriction with no airflow obstruction.  FEV1 was 69%, ratio 85, FVC 60%, DLCO 108%.,  No significant bronchodilator response.  HRCT 05/2021 0.9 cm cavitary nodule in left upper lobe, bibasal peripheral groundglass opacities?  NSIP versus postinflammatory , chronic T6 and T8 compression fractures  03/2021 Sniff test neg   Echo 09/2020 normal LV function, mild diastolic dysfunction  PFTs 12/2010 near nml CT T-spine 06/2018 >> compression deformities at T6, T11and T12 acute T8 fracure Degenerative disc disease of C-spine noted  Review of Systems neg for any significant sore throat, dysphagia, itching, sneezing, nasal congestion or excess/ purulent secretions, fever, chills, sweats, unintended wt loss, pleuritic or exertional cp, hempoptysis, orthopnea pnd or change in chronic leg swelling. Also denies presyncope, palpitations, heartburn, abdominal pain, nausea, vomiting, diarrhea or change in bowel or urinary habits, dysuria,hematuria, rash, arthralgias, visual complaints, headache, numbness weakness or ataxia.     Objective:   Physical Exam  Gen. Pleasant, obese, in no distress ENT - no lesions, no post nasal drip Neck: No JVD, no thyromegaly, no carotid bruits Lungs: no use of  accessory muscles, no dullness to percussion, decreased without rales or rhonchi  Cardiovascular: Rhythm regular, heart sounds  normal, no murmurs or gallops, no peripheral edema Musculoskeletal: No deformities, no cyanosis or clubbing , no tremors       Assessment & Plan:

## 2021-07-08 NOTE — Assessment & Plan Note (Signed)
Now on Sinemet with decreasing her tremors, follows with neurology

## 2021-07-09 LAB — CYCLIC CITRUL PEPTIDE ANTIBODY, IGG: Cyclic Citrullin Peptide Ab: <16 UNITS

## 2021-07-09 LAB — ANA: Anti Nuclear Antibody (ANA): NEGATIVE

## 2021-07-15 NOTE — Progress Notes (Signed)
Tried calling the pt and there was no answer- LMTCB.  

## 2021-07-17 ENCOUNTER — Telehealth: Payer: Self-pay | Admitting: Pulmonary Disease

## 2021-07-17 DIAGNOSIS — Z1231 Encounter for screening mammogram for malignant neoplasm of breast: Secondary | ICD-10-CM | POA: Diagnosis not present

## 2021-07-17 NOTE — Telephone Encounter (Signed)
Pt is returning phone call in regards to test results. Pls regard; [phone]

## 2021-07-17 NOTE — Progress Notes (Signed)
Assessment/Plan:   1.  Parkinsons Disease  -continue carbidopa/levodopa 25/100, 1 tablet 3 times per day.  -Patient understands that tremor is exacerbated by her lung medications, albuterol and Symbicort.  -Patient and daughter asked several questions and answered those to the best of my ability.  Daughter had not been here previous.  Daughter expressed concern about patient's home (more than one-story, no bathroom on the first floor, kitchen is not on the first floor).  We talked about future planning.  -Discussed power of attorney and healthcare power of attorney.  Sounds like patient has some of this completed already.  2.  Significant hyperreflexia, likely due to severe cervical central canal stenosis.  -Patient following with Dr. Venetia Constable   Subjective:   [name] was seen today in follow up for Parkinsons disease, newly diagnosed last visit.  My previous records were reviewed prior to todays visit as well as outside records available to me.  Pt with her daughter who supplements hx.  She was started on levodopa last visit.  She reports that she "didn't know that she was sick" so isn't sure if meds help or not.  She has been attending rehab.  Those records are reviewed.  Last time, I was concerned about her hyperreflexia and multiple falls.  MRI brain was completed and demonstrated mild small vessel disease.  MRI cervical spine was completed with evidence of severe central canal stenosis at C5-C6.  I referred the patient to Dr. Venetia Constable.  She saw him on May 25, but I did not get any notes from him.  They are very limited notes within Care Everywhere.  Pt states that he told her that she didn't need surgery until she has neck pain.  She is doing cycle classes 2 days per week at [facility].    Current prescribed movement disorder medications: Carbidopa/levodopa 25/100, 1 tablet 3 times per day.   PREVIOUS MEDICATIONS: Sinemet  ALLERGIES:   Allergies  Allergen Reactions    Erythromycin Base Nausea And Vomiting   Nitrofurantoin Macrocrystal Other (See Comments)    Unknown reaction    CURRENT MEDICATIONS:  Outpatient Encounter Medications as of 07/18/2021  Medication Sig   albuterol (VENTOLIN HFA) 108 (90 Base) MCG/ACT inhaler Inhale 1 puff into the lungs every 6 (six) hours as needed for wheezing or shortness of breath. Patient reports albuterol inhaler use, 1 puff as needed. She does not know the specific dose /previous written order details   amoxicillin-clavulanate (AUGMENTIN) 875-125 MG tablet Take 1 tablet by mouth 2 (two) times daily.   atorvastatin (LIPITOR) 40 MG tablet Take 40 mg by mouth daily.   budesonide-formoterol (SYMBICORT) 80-4.5 MCG/ACT inhaler Inhale 2 puffs into the lungs 2 (two) times daily. Patient reports she does not know specific dose/orders; only able to state that she has been prescribed Symbicort and Albuterol inhalers in the past. Dr. Elsworth Soho aware; no new orders rcvd.   buPROPion (WELLBUTRIN XL) 150 MG 24 hr tablet Take 150 mg by mouth daily.   calcium-vitamin D (OSCAL WITH D) 250-125 MG-UNIT tablet Take 1 tablet by mouth daily.   carbidopa-levodopa (SINEMET IR) 25-100 MG tablet Take 1 tablet by mouth 3 (three) times daily.   pantoprazole (PROTONIX) 20 MG tablet Take 20 mg by mouth daily.   sertraline (ZOLOFT) 100 MG tablet Take by mouth.   valsartan-hydrochlorothiazide (DIOVAN-HCT) 160-12.5 MG tablet Take 1 tablet by mouth daily.   [DISCONTINUED] atorvastatin (LIPITOR) 20 MG tablet Take by mouth.   [DISCONTINUED] Cholecalciferol 25  MCG (1000 UT) tablet Take by mouth.   [DISCONTINUED] docusate sodium (COLACE) 100 MG capsule Take 1 capsule (100 mg total) by mouth daily.   [DISCONTINUED] Omega-3 1000 MG CAPS Take by mouth.   [DISCONTINUED] omeprazole (PRILOSEC) 20 MG capsule Take by mouth.   [DISCONTINUED] sertraline (ZOLOFT) 100 MG tablet Take 100 mg by mouth daily.   [DISCONTINUED] traMADol (ULTRAM) 50 MG tablet Take 1 tablet (50 mg  total) by mouth every 6 (six) hours as needed.   No facility-administered encounter medications on file as of 07/18/2021.    Objective:   PHYSICAL EXAMINATION:    VITALS:   Vitals:   07/18/21 1330  BP: 136/74  Pulse: (!) 101  SpO2: 99%  Weight: 176 lb 6.4 oz (80 kg)  Height: 5' (1.524 m)    GEN:  The patient appears stated age and is in NAD. HEENT:  Normocephalic, atraumatic.  The mucous membranes are moist. The superficial temporal arteries are without ropiness or tenderness. CV:  RRR Lungs:  CTAB Neck/HEME:  There are no carotid bruits bilaterally.  Neurological examination:  Orientation: The patient is alert and oriented x3. Cranial nerves: There is good facial symmetry with facial hypomimia. The speech is fluent and clear. Soft palate rises symmetrically and there is no tongue deviation. Hearing is intact to conversational tone. Sensation: Sensation is intact to light touch throughout Motor: Strength is at least antigravity x4.  Movement examination: Tone: There is normal tone today Abnormal movements: there is no LUE rest tremor today but there is RUE rest tremor today (mild) Coordination:  There is slowness with RAM's  Gait and Station: The patient has pushes off of the chair to arise.  She is forward flexed.  Gait is a bit antalgic.  No tremor with ambulation  I have reviewed and interpreted the following labs independently    Chemistry      Component Value Date/Time   NA 140 07/08/2021 1157   K 3.7 07/08/2021 1157   CL 103 07/08/2021 1157   CO2 27 07/08/2021 1157   BUN 18 07/08/2021 1157   CREATININE 0.91 07/08/2021 1157      Component Value Date/Time   CALCIUM 9.8 07/08/2021 1157   ALKPHOS 44 07/08/2021 1157   AST 45 (H) 07/08/2021 1157   ALT 20 07/08/2021 1157   BILITOT 0.6 07/08/2021 1157       No results found for: WBC, HGB, HCT, MCV, PLT  No results found for: TSH   Total time spent on today's visit was 42 minutes, including both  face-to-face time and nonface-to-face time.  Time included that spent on review of records (prior notes available to me/labs/imaging if pertinent), discussing treatment and goals, answering patient's questions and coordinating care.  Cc:  Kelton Pillar, MD

## 2021-07-17 NOTE — Telephone Encounter (Signed)
Left message for patient to call back regarding results and recommendations. °

## 2021-07-18 ENCOUNTER — Ambulatory Visit (INDEPENDENT_AMBULATORY_CARE_PROVIDER_SITE_OTHER): Payer: Medicare Other | Admitting: Neurology

## 2021-07-18 ENCOUNTER — Encounter: Payer: Self-pay | Admitting: Neurology

## 2021-07-18 ENCOUNTER — Other Ambulatory Visit: Payer: Self-pay

## 2021-07-18 VITALS — BP 136/74 | HR 101 | Ht 60.0 in | Wt 176.4 lb

## 2021-07-18 DIAGNOSIS — G20 Parkinson's disease: Secondary | ICD-10-CM | POA: Diagnosis not present

## 2021-07-18 NOTE — Patient Instructions (Signed)
Online Resources for Power over Parkinson's Group   [employer] Online Groups  Power over Parkinson's Group :    Upcoming Power over Parkinson's Meetings:  2nd Wednesdays of the month at 2 pm:  June 8th, July 13th [place] at [email or web address] if interested in participating in this group Parkinson's Care Partners Group:    3rd Mondays, Contact Misty Paladino Atypical Parkinsonian Patient Group:   4th Wednesdays, [place] If you are interested in participating in these groups with Misty, please contact her directly for how to join those meetings.  Her contact information is [email or web address].   [place]:  [email or web address] PD Health at Home continues:  Mindfulness Mondays, Expert Briefing Tuesdays, Wellness Wednesdays, Take Time Thursdays, Fitness Fridays Register for expert briefings (webinars) at [email or web address]  Please check out their website to sign up for emails and see their full online offerings  [facility]:  [email or web address]  Check out additional information on their website to see their full online offerings  [facility]:  [email or web address] Upcoming Webinar:  Stay tuned Care Partner Monthly Meetup.  With [facility] [place].  First Tuesday of each month, 2 pm Joy Breaks:  First Wednesday of each month, 2-3 pm. There will be art, doodling, making, crafting, listening, laughing, stories, and everything in between. No art experience necessary. No supplies required. Just show up for joy!  Register on their website. Check out additional information to Live Well Today on their website  Parkinson and Movement Disorders (PMD) Alliance:  www.pmdalliance.org [employer] Online:  Online Education Events Sign up for emails, which are sent weekly to give you updates on programming and online offerings     Parkinson's Association of [place]:   www.[email or web address] Information on online support groups, education events, and online exercises including Yoga, Parkinson's exercises and more-LOTS of information on links to PD resources and online events Virtual Support Group through Parkinson's Association of [place];(These are typically scheduled for the 1st Wednesday of the month at 2 pm).  Visit website for details.  Additional links for movement activities: PWR! Moves Classes at [place] RESUMED!  Wednesdays 10 and 11 am.  Contact Amy Marriott, PT [email or web address] or [phone] if interested Here is a link to the PWR!Moves classes on Zoom from [phone] - Daily Mon-Sat at 10:00. Via Zoom, FREE and open to all.  There is also a link below via Facebook if you use that platform. [place] [email or web address] Parkinson's Wellness Recovery (PWR! Moves)  www.[email or web address] Info on the PWR! Virtual Experience:  You will have access to our expertise through self-assessment, guided plans that start with the PD-specific fundamentals, educational content, tips, Q&A with an expert, and a growing PrepaidParty.no of PD-specific pre-recorded and live exercise classes of varying types and intensity - both physical and cognitive! If that is not enough, we offer 1:1 wellness consultations (in-person or virtual) to personalize your PWR! Art therapist.  Check out the PWR! Move of the month on the [place] [facility] website:  [address] [email or web address] Fridays:  As part of the PD Health @ Home program, this free video series focuses each week on one aspect of fitness designed to support people living with Parkinson's.  These weekly videos highlight  the [place] recent fitness guidelines for people with Parkinson's disease.  [address] Dance for PD website is offering free, live-stream classes throughout the week, as well as links to HollywoodSale.dk of classes:  [email or web address] Dance for Parkinson's Class:  Dance Project  of [place].  Free offering for people with Parkinson's and care partners; virtual class.  For more information, contact [phone] or email Ruffin Frederick at [email or web address] Virtual dance and Pilates for Parkinson's classes: Click on the Community Tab> Parkinson's Movement Initiative Tab.  To register for classes and for more information, visit [email or web address] and click the "community" tab.     [employer] Parkinson's Cycling Classes  [employer]: 1pm on Fridays-Live classes at [employer] ([employer] at [email or web address] or [phone]) [employer]: Virtual Classes Mondays and Thursdays Jeanette Caprice classes Tuesday, Wednesday and Thursday (contact [place] at [email or web address]  or [phone])  [facility] Boxing Three levels of classes are offered Tuesdays and Thursdays:  10:30 am,  12 noon & 1:45 pm at [facility].  Active Stretching with Paula Compton Class starting in March, on Fridays To observe a class or for  more information, call [phone] or email [email or web address] Well[employer]Spring [employer]: Online Caregiver Education Opportunities:  www.well-springsolutions.org/caregiver-education/caregiver-support-group.  You may also contact Vickki Muff at [email or web address] or [phone].   Well-Spring Navigator:  ([phone] program, a free service to help individuals and families through the journey of determining care for older adults.  The "Navigator" is a [employer], Education officer, museum, who will speak with a prospective  client and/or loved ones to provide an assessment of the situation and a set of recommendations for a personalized care plan -- all free of charge, and whether Well-Spring Solutions offers the needed service or not. If the need is not a service we provide, we are well-connected with reputable programs in town that we can refer you to.  www.well-springsolutions.org or to speak with the Navigator, call [phone].

## 2021-07-22 NOTE — Progress Notes (Signed)
Spoke with pt and notified of results per Dr. Elsworth Soho. Pt verbalized understanding and denied any questions. Copy faxed to Dr Laurann Montana.

## 2021-07-24 NOTE — Telephone Encounter (Signed)
See lab result note. Pt has been informed of results. Will close this encounter.

## 2021-08-04 DIAGNOSIS — Z86010 Personal history of colonic polyps: Secondary | ICD-10-CM | POA: Diagnosis not present

## 2021-08-04 DIAGNOSIS — R0602 Shortness of breath: Secondary | ICD-10-CM | POA: Diagnosis not present

## 2021-08-04 DIAGNOSIS — K5900 Constipation, unspecified: Secondary | ICD-10-CM | POA: Diagnosis not present

## 2021-08-04 DIAGNOSIS — K219 Gastro-esophageal reflux disease without esophagitis: Secondary | ICD-10-CM | POA: Diagnosis not present

## 2021-08-04 DIAGNOSIS — G20 Parkinson's disease: Secondary | ICD-10-CM | POA: Diagnosis not present

## 2021-08-08 ENCOUNTER — Telehealth: Payer: Self-pay | Admitting: Pulmonary Disease

## 2021-08-08 NOTE — Telephone Encounter (Signed)
Received fax from Dr. Michail Sermon with [name] to have colonoscopy on 11/27/21.  Patient has follow up appointment with Dr. Elsworth Soho on 10/14/21 can address then

## 2021-09-03 ENCOUNTER — Ambulatory Visit
Admission: RE | Admit: 2021-09-03 | Discharge: 2021-09-03 | Disposition: A | Discharge status: Home / Self Care | Payer: Medicare Other | Source: Ambulatory Visit | Attending: Pulmonary Disease | Admitting: Pulmonary Disease

## 2021-09-03 DIAGNOSIS — J4530 Mild persistent asthma, uncomplicated: Secondary | ICD-10-CM

## 2021-09-03 DIAGNOSIS — I700 Atherosclerosis of aorta: Secondary | ICD-10-CM | POA: Diagnosis not present

## 2021-09-03 DIAGNOSIS — R030 Elevated blood-pressure reading, without diagnosis of hypertension: Secondary | ICD-10-CM | POA: Diagnosis not present

## 2021-09-03 DIAGNOSIS — R0600 Dyspnea, unspecified: Secondary | ICD-10-CM

## 2021-09-03 DIAGNOSIS — M810 Age-related osteoporosis without current pathological fracture: Secondary | ICD-10-CM

## 2021-09-03 DIAGNOSIS — R0609 Other forms of dyspnea: Secondary | ICD-10-CM

## 2021-09-03 DIAGNOSIS — G20A1 Parkinson's disease without dyskinesia, without mention of fluctuations: Secondary | ICD-10-CM

## 2021-09-03 DIAGNOSIS — J849 Interstitial pulmonary disease, unspecified: Secondary | ICD-10-CM | POA: Diagnosis not present

## 2021-09-03 DIAGNOSIS — G20 Parkinson's disease: Secondary | ICD-10-CM

## 2021-09-03 DIAGNOSIS — G959 Disease of spinal cord, unspecified: Secondary | ICD-10-CM | POA: Diagnosis not present

## 2021-09-05 NOTE — Progress Notes (Signed)
Called and there was no answer- LMTCB 

## 2021-09-09 ENCOUNTER — Telehealth: Payer: Self-pay | Admitting: Pulmonary Disease

## 2021-09-09 DIAGNOSIS — R911 Solitary pulmonary nodule: Secondary | ICD-10-CM

## 2021-09-09 NOTE — Telephone Encounter (Signed)
Nodule is unchanged compared to June which is good news. Suggest follow-up high-resolution CT chest in 6 months   I have called the pt and she is aware of results. I have placed the order for the high resolution CT to be done in 6 months.  Pt is aware.

## 2021-10-09 DIAGNOSIS — E7800 Pure hypercholesterolemia, unspecified: Secondary | ICD-10-CM | POA: Diagnosis not present

## 2021-10-09 DIAGNOSIS — E559 Vitamin D deficiency, unspecified: Secondary | ICD-10-CM | POA: Diagnosis not present

## 2021-10-09 DIAGNOSIS — J45909 Unspecified asthma, uncomplicated: Secondary | ICD-10-CM | POA: Diagnosis not present

## 2021-10-09 DIAGNOSIS — Z23 Encounter for immunization: Secondary | ICD-10-CM | POA: Diagnosis not present

## 2021-10-09 DIAGNOSIS — Z0000 Encounter for general adult medical examination without abnormal findings: Secondary | ICD-10-CM | POA: Diagnosis not present

## 2021-10-09 DIAGNOSIS — E1121 Type 2 diabetes mellitus with diabetic nephropathy: Secondary | ICD-10-CM | POA: Diagnosis not present

## 2021-10-09 DIAGNOSIS — Z1389 Encounter for screening for other disorder: Secondary | ICD-10-CM | POA: Diagnosis not present

## 2021-10-09 DIAGNOSIS — I5032 Chronic diastolic (congestive) heart failure: Secondary | ICD-10-CM | POA: Diagnosis not present

## 2021-10-09 DIAGNOSIS — D049 Carcinoma in situ of skin, unspecified: Secondary | ICD-10-CM | POA: Diagnosis not present

## 2021-10-09 DIAGNOSIS — I129 Hypertensive chronic kidney disease with stage 1 through stage 4 chronic kidney disease, or unspecified chronic kidney disease: Secondary | ICD-10-CM | POA: Diagnosis not present

## 2021-10-09 DIAGNOSIS — K219 Gastro-esophageal reflux disease without esophagitis: Secondary | ICD-10-CM | POA: Diagnosis not present

## 2021-10-14 ENCOUNTER — Encounter: Payer: Self-pay | Admitting: Pulmonary Disease

## 2021-10-14 ENCOUNTER — Other Ambulatory Visit: Payer: Self-pay

## 2021-10-14 ENCOUNTER — Ambulatory Visit (INDEPENDENT_AMBULATORY_CARE_PROVIDER_SITE_OTHER): Payer: Medicare Other | Admitting: Pulmonary Disease

## 2021-10-14 DIAGNOSIS — R911 Solitary pulmonary nodule: Secondary | ICD-10-CM | POA: Diagnosis not present

## 2021-10-14 DIAGNOSIS — R0609 Other forms of dyspnea: Secondary | ICD-10-CM | POA: Diagnosis not present

## 2021-10-14 NOTE — Patient Instructions (Signed)
HR CT chest in march 2023 Nodule appears stable Your heart rate runs high

## 2021-10-14 NOTE — Progress Notes (Signed)
   Subjective:    Patient ID: Briana [name], female    DOB: Oct 30, 1946, 75 y.o.   MRN: [id number]  HPI  75 yo remote smoker for follow-up of dyspnea?ILD  PMH - Parkinson's  -Peripheral basilar groundglass infiltrates on HR CT chest concerning for early ILD especially NSIP.  37-month follow-up visit. She states dyspnea is at baseline.  [facility]  She has been diagnosed with cervical myelopathy and was offered operation by neurosurgery, she is developing right upper extremity weakness.  We obtained serology which was negative. We reviewed CT chest from 08/2021    Significant tests/ events reviewed PFTs 04/2021 moderate restriction with no airflow obstruction.  FEV1 was 69%, ratio 85, FVC 60%, DLCO 108%.,  No significant bronchodilator response.  Serology-ANA, CCP negative, ESR 45  CT chest wo con 08/2021 >> Diffuse areas of mosaic attenuation in the chest, left upper lobe nodule stable 9 x 5 mm, right upper lobe 5 mm pleural-based nodule stable  HRCT 05/2021 0.9 cm cavitary nodule in left upper lobe, bibasal peripheral groundglass opacities?  NSIP versus postinflammatory , chronic T6 and T8 compression fractures   03/2021 Sniff test neg    Echo 09/2020 normal LV function, mild diastolic dysfunction   PFTs 12/2010 near nml CT T-spine 06/2018 >> compression deformities at T6, T11and T12 acute T8 fracure Degenerative disc disease of C-spine noted   Review of Systems neg for any significant sore throat, dysphagia, itching, sneezing, nasal congestion or excess/ purulent secretions, fever, chills, sweats, unintended wt loss, pleuritic or exertional cp, hempoptysis, orthopnea pnd or change in chronic leg swelling. Also denies presyncope, palpitations, heartburn, abdominal pain, nausea, vomiting, diarrhea or change in bowel or urinary habits, dysuria,hematuria, rash, arthralgias, visual complaints, headache, numbness weakness or ataxia.      Objective:   Physical Exam  Gen. Pleasant, obese, in no distress ENT - no lesions, no post nasal drip Neck: No JVD, no thyromegaly, no carotid bruits Lungs: no use of accessory muscles, no dullness to percussion, decreased without rales or rhonchi  Cardiovascular: Rhythm regular, heart sounds  normal, no murmurs or gallops, no peripheral edema Musculoskeletal: No deformities, no cyanosis or clubbing , no tremors       Assessment & Plan:

## 2021-10-14 NOTE — Assessment & Plan Note (Signed)
Unclear cause.  HRCT seem to suggest mild ILD but repeat CT chest shows mosaic pattern more consistent with small vessel disease or bronchiolitis PFTs have shown mild restriction, serologies negative.  Not fully convinced that this is ILD.  Best to take a wait and watch approach , we will reassess in 6 months with HRCT for disease progression. Meanwhile have encouraged her to continue rehab program. She is cleared for colonoscopy procedure. She would also be cleared for neck surgery if needed

## 2021-10-14 NOTE — Assessment & Plan Note (Signed)
9-month follow-up CT scan can be arranged to follow-up on left upper lobe nodule.  57-month stability is reassuring

## 2021-10-15 NOTE — Telephone Encounter (Signed)
OV notes and form faxed to [facility] for surgical clearance. Confirmation received. Nothing further needed at this time.

## 2021-10-24 ENCOUNTER — Other Ambulatory Visit: Payer: Self-pay | Admitting: Neurological Surgery

## 2021-11-10 ENCOUNTER — Telehealth: Payer: Self-pay

## 2021-11-10 NOTE — Telephone Encounter (Signed)
New message - website CoverMyMeds  Your information has been sent to [facility].  Haydan Crowl Key: [id number] - [place] Case ID: [id number] Need help? Call us at [phone] Status Sent to [facility] Carbidopa-Levodopa 25-100MG  dispersible tablets Form [facility] Electronic Prior Authorization Form (2017 NCPDP)

## 2021-11-14 ENCOUNTER — Other Ambulatory Visit: Payer: Self-pay | Admitting: Neurology

## 2021-11-17 ENCOUNTER — Other Ambulatory Visit: Payer: Self-pay

## 2021-11-27 DIAGNOSIS — K5730 Diverticulosis of large intestine without perforation or abscess without bleeding: Secondary | ICD-10-CM | POA: Diagnosis not present

## 2021-11-27 DIAGNOSIS — K648 Other hemorrhoids: Secondary | ICD-10-CM | POA: Diagnosis not present

## 2021-11-27 DIAGNOSIS — Z86010 Personal history of colonic polyps: Secondary | ICD-10-CM | POA: Diagnosis not present

## 2021-11-27 DIAGNOSIS — D124 Benign neoplasm of descending colon: Secondary | ICD-10-CM | POA: Diagnosis not present

## 2021-12-02 DIAGNOSIS — D124 Benign neoplasm of descending colon: Secondary | ICD-10-CM | POA: Diagnosis not present

## 2021-12-11 NOTE — Pre-Procedure Instructions (Signed)
Surgical Instructions    Your procedure is scheduled on Tuesday, December 27th.  Report to [facility] Main Entrance "A" at 5:30 A.M., then check in with the Admitting office.  Call this number if you have problems the morning of surgery:  [phone]   If you have any questions prior to your surgery date call [phone]: Open Monday-Friday 8am-4pm    Remember:  Do not eat after midnight the night before your surgery  You may drink clear liquids until 4:30 a.m. the morning of your surgery.   Clear liquids allowed are: Water, Non-Citrus Juices (without pulp), Carbonated Beverages, Clear Tea, Black Coffee Only, and Gatorade    Take these medicines the morning of surgery with A SIP OF WATER  atorvastatin (LIPITOR) buPROPion (WELLBUTRIN XL carbidopa-levodopa (SINEMET IR)  pantoprazole (PROTONIX) sertraline (ZOLOFT)  budesonide-formoterol (SYMBICORT) Albuterol Inhaler-if needed, please bring with you to the hospital.  As of today, STOP taking any Aspirin (unless otherwise instructed by your surgeon) Aleve, Naproxen, Ibuprofen, Motrin, Advil, Goody's, BC's, all herbal medications, fish oil, and all vitamins.                     Do NOT Smoke (Tobacco/Vaping) or drink Alcohol 24 hours prior to your procedure.  If you use a CPAP at night, you may bring all equipment for your overnight stay.   Contacts, glasses, piercing's, hearing aid's, dentures or partials may not be worn into surgery, please bring cases for these belongings.    For patients admitted to the hospital, discharge time will be determined by your treatment team.   Patients discharged the day of surgery will not be allowed to drive home, and someone needs to stay with them for 24 hours.  NO VISITORS WILL BE ALLOWED IN PRE-OP WHERE PATIENTS GET READY FOR SURGERY.  ONLY 1 SUPPORT PERSON MAY BE PRESENT IN THE WAITING ROOM WHILE YOU ARE IN SURGERY.  IF YOU ARE TO BE ADMITTED, ONCE YOU ARE IN YOUR ROOM YOU WILL BE ALLOWED TWO  (2) VISITORS.  Minor children may have two parents present. Special consideration for safety and communication needs will be reviewed on a case by case basis.   Special instructions:   [place]- Preparing For Surgery  Before surgery, you can play an important role. Because skin is not sterile, your skin needs to be as free of germs as possible. You can reduce the number of germs on your skin by washing with CHG (chlorahexidine gluconate) Soap before surgery.  CHG is an antiseptic cleaner which kills germs and bonds with the skin to continue killing germs even after washing.    Oral Hygiene is also important to reduce your risk of infection.  Remember - BRUSH YOUR TEETH THE MORNING OF SURGERY WITH YOUR REGULAR TOOTHPASTE  Please do not use if you have an allergy to CHG or antibacterial soaps. If your skin becomes reddened/irritated stop using the CHG.  Do not shave (including legs and underarms) for at least 48 hours prior to first CHG shower. It is OK to shave your face.  Please follow these instructions carefully.   Shower the NIGHT BEFORE SURGERY and the MORNING OF SURGERY  If you chose to wash your hair, wash your hair first as usual with your normal shampoo.  After you shampoo, rinse your hair and body thoroughly to remove the shampoo.  Use CHG Soap as you would any other liquid soap. You can apply CHG directly to the skin and wash gently with a  scrungie or a clean washcloth.   Apply the CHG Soap to your body ONLY FROM THE NECK DOWN.  Do not use on open wounds or open sores. Avoid contact with your eyes, ears, mouth and genitals (private parts). Wash Face and genitals (private parts)  with your normal soap.   Wash thoroughly, paying special attention to the area where your surgery will be performed.  Thoroughly rinse your body with warm water from the neck down.  DO NOT shower/wash with your normal soap after using and rinsing off the CHG Soap.  Pat yourself dry with a CLEAN  TOWEL.  Wear CLEAN PAJAMAS to bed the night before surgery  Place CLEAN SHEETS on your bed the night before your surgery  DO NOT SLEEP WITH PETS.   Day of Surgery: Shower with CHG soap. Do not wear jewelry, make up, nail polish, gel polish, artificial nails, or any other type of covering on natural nails including finger and toenails. If patients have artificial nails, gel coating, etc. that need to be removed by a nail salon please have this removed prior to surgery. Surgery may need to be canceled/delayed if the surgeon/ anesthesia feels like the patient is unable to be adequately monitored. Do not wear lotions, powders, perfumes, or deodorant. Do not shave 48 hours prior to surgery.  Do not bring valuables to the hospital. [facility] is not responsible for any belongings or valuables. Wear Clean/Comfortable clothing the morning of surgery Remember to brush your teeth WITH YOUR REGULAR TOOTHPASTE.   Please read over the following fact sheets that you were given.   3 days prior to your procedure or After your COVID test   You are not required to quarantine however you are required to wear a well-fitting mask when you are out and around people not in your household. If your mask becomes wet or soiled, replace with a new one.   Wash your hands often with soap and water for 20 seconds or clean your hands with an alcohol-based hand sanitizer that contains at least 60% alcohol.   Do not share personal items.   Notify your provider:  o if you are in close contact with someone who has COVID  o or if you develop a fever of 100.4 or greater, sneezing, cough, sore throat, shortness of breath or body aches.

## 2021-12-12 ENCOUNTER — Encounter (HOSPITAL_COMMUNITY)
Admission: RE | Admit: 2021-12-12 | Discharge: 2021-12-12 | Disposition: A | Discharge status: Home / Self Care | Payer: Medicare Other | Source: Ambulatory Visit | Attending: Neurological Surgery | Admitting: Neurological Surgery

## 2021-12-12 ENCOUNTER — Other Ambulatory Visit: Payer: Self-pay

## 2021-12-12 ENCOUNTER — Encounter (HOSPITAL_COMMUNITY): Payer: Self-pay

## 2021-12-12 DIAGNOSIS — Z85828 Personal history of other malignant neoplasm of skin: Secondary | ICD-10-CM | POA: Insufficient documentation

## 2021-12-12 DIAGNOSIS — I2510 Atherosclerotic heart disease of native coronary artery without angina pectoris: Secondary | ICD-10-CM

## 2021-12-12 DIAGNOSIS — M8580 Other specified disorders of bone density and structure, unspecified site: Secondary | ICD-10-CM | POA: Insufficient documentation

## 2021-12-12 DIAGNOSIS — M4802 Spinal stenosis, cervical region: Secondary | ICD-10-CM | POA: Insufficient documentation

## 2021-12-12 DIAGNOSIS — I452 Bifascicular block: Secondary | ICD-10-CM | POA: Insufficient documentation

## 2021-12-12 DIAGNOSIS — I700 Atherosclerosis of aorta: Secondary | ICD-10-CM | POA: Insufficient documentation

## 2021-12-12 DIAGNOSIS — R296 Repeated falls: Secondary | ICD-10-CM | POA: Diagnosis not present

## 2021-12-12 DIAGNOSIS — G20 Parkinson's disease: Secondary | ICD-10-CM | POA: Insufficient documentation

## 2021-12-12 DIAGNOSIS — R0600 Dyspnea, unspecified: Secondary | ICD-10-CM | POA: Diagnosis not present

## 2021-12-12 DIAGNOSIS — Z01818 Encounter for other preprocedural examination: Secondary | ICD-10-CM | POA: Diagnosis not present

## 2021-12-12 DIAGNOSIS — E7800 Pure hypercholesterolemia, unspecified: Secondary | ICD-10-CM | POA: Diagnosis not present

## 2021-12-12 DIAGNOSIS — I10 Essential (primary) hypertension: Secondary | ICD-10-CM | POA: Diagnosis not present

## 2021-12-12 DIAGNOSIS — K769 Liver disease, unspecified: Secondary | ICD-10-CM | POA: Diagnosis not present

## 2021-12-12 DIAGNOSIS — M4712 Other spondylosis with myelopathy, cervical region: Secondary | ICD-10-CM | POA: Diagnosis not present

## 2021-12-12 DIAGNOSIS — E119 Type 2 diabetes mellitus without complications: Secondary | ICD-10-CM | POA: Insufficient documentation

## 2021-12-12 DIAGNOSIS — K449 Diaphragmatic hernia without obstruction or gangrene: Secondary | ICD-10-CM | POA: Insufficient documentation

## 2021-12-12 DIAGNOSIS — Z20822 Contact with and (suspected) exposure to covid-19: Secondary | ICD-10-CM | POA: Diagnosis not present

## 2021-12-12 DIAGNOSIS — K760 Fatty (change of) liver, not elsewhere classified: Secondary | ICD-10-CM | POA: Diagnosis not present

## 2021-12-12 DIAGNOSIS — R292 Abnormal reflex: Secondary | ICD-10-CM | POA: Diagnosis not present

## 2021-12-12 DIAGNOSIS — Z87891 Personal history of nicotine dependence: Secondary | ICD-10-CM | POA: Diagnosis not present

## 2021-12-12 HISTORY — DX: Other specified postprocedural states: Z98.890

## 2021-12-12 HISTORY — DX: Parkinson's disease without dyskinesia, without mention of fluctuations: G20.A1

## 2021-12-12 HISTORY — DX: Fatty (change of) liver, not elsewhere classified: K76.0

## 2021-12-12 HISTORY — DX: Dyspnea, unspecified: R06.00

## 2021-12-12 HISTORY — DX: Malignant (primary) neoplasm, unspecified: C80.1

## 2021-12-12 HISTORY — DX: Other specified postprocedural states: R11.2

## 2021-12-12 HISTORY — DX: Other complications of anesthesia, initial encounter: T88.59XA

## 2021-12-12 HISTORY — DX: Nausea with vomiting, unspecified: R11.2

## 2021-12-12 LAB — COMPREHENSIVE METABOLIC PANEL
ALT: 6 U/L (ref 0–44)
AST: 43 U/L — ABNORMAL HIGH (ref 15–41)
Albumin: 4 g/dL (ref 3.5–5.0)
Alkaline Phosphatase: 44 U/L (ref 38–126)
Anion gap: 8 (ref 5–15)
BUN: 18 mg/dL (ref 8–23)
CO2: 28 mmol/L (ref 22–32)
Calcium: 9.3 mg/dL (ref 8.9–10.3)
Chloride: 102 mmol/L (ref 98–111)
Creatinine, Ser: 0.99 mg/dL (ref 0.44–1.00)
GFR, Estimated: 59 mL/min — ABNORMAL LOW (ref >60)
Glucose, Bld: 114 mg/dL — ABNORMAL HIGH (ref 70–99)
Potassium: 3.6 mmol/L (ref 3.5–5.1)
Sodium: 138 mmol/L (ref 135–145)
Total Bilirubin: 0.9 mg/dL (ref 0.3–1.2)
Total Protein: 7 g/dL (ref 6.5–8.1)

## 2021-12-12 LAB — TYPE AND SCREEN
ABO/RH(D): A POS
Antibody Screen: NEGATIVE

## 2021-12-12 LAB — CBC
HCT: 36.9 % (ref 36.0–46.0)
Hemoglobin: 11.9 g/dL — ABNORMAL LOW (ref 12.0–15.0)
MCH: 28.2 pg (ref 26.0–34.0)
MCHC: 32.2 g/dL (ref 30.0–36.0)
MCV: 87.4 fL (ref 80.0–100.0)
Platelets: 218 10*3/uL (ref 150–400)
RBC: 4.22 MIL/uL (ref 3.87–5.11)
RDW: 15.3 % (ref 11.5–15.5)
WBC: 6.9 10*3/uL (ref 4.0–10.5)
nRBC: 0 % (ref 0.0–0.2)

## 2021-12-12 LAB — SURGICAL PCR SCREEN
MRSA, PCR: NEGATIVE
Staphylococcus aureus: NEGATIVE

## 2021-12-12 LAB — GLUCOSE, CAPILLARY: Glucose-Capillary: 147 mg/dL — ABNORMAL HIGH (ref 70–99)

## 2021-12-12 LAB — HEMOGLOBIN A1C
Hgb A1c MFr Bld: 6 % — ABNORMAL HIGH (ref 4.8–5.6)
Mean Plasma Glucose: 125.5 mg/dL

## 2021-12-12 LAB — SARS CORONAVIRUS 2 (TAT 6-24 HRS): SARS Coronavirus 2: NEGATIVE

## 2021-12-12 NOTE — Anesthesia Preprocedure Evaluation (Addendum)
Anesthesia Evaluation  Patient identified by MRN, date of birth, ID band Patient awake    Reviewed: Allergy & Precautions, NPO status , Patient's Chart, lab work & pertinent test results  History of Anesthesia Complications (+) PONV and history of anesthetic complications  Airway Mallampati: III  TM Distance: >3 FB Neck ROM: Full    Dental  (+) Dental Advisory Given, Teeth Intact   Pulmonary shortness of breath, asthma , former smoker,    Pulmonary exam normal breath sounds clear to auscultation       Cardiovascular hypertension, Pt. on medications  Rhythm:Regular Rate:Tachycardia  Echo 09/25/20: Impression: Mild LVH. LVEF 50-55%. Possible diastolic dysfunction. Normal RV size and systolic function. RA not well visualized.     Neuro/Psych PSYCHIATRIC DISORDERS Depression negative neurological ROS     GI/Hepatic negative GI ROS, Neg liver ROS,   Endo/Other  diabetes  Renal/GU negative Renal ROS     Musculoskeletal negative musculoskeletal ROS (+)   Abdominal (+) + obese,   Peds  Hematology negative hematology ROS (+)   Anesthesia Other Findings   Reproductive/Obstetrics                          Anesthesia Physical Anesthesia Plan  ASA: 3  Anesthesia Plan: General   Post-op Pain Management: Tylenol PO (pre-op) and Dilaudid IV   Induction: Intravenous  PONV Risk Score and Plan: 4 or greater and Ondansetron, Dexamethasone, Treatment may vary due to age or medical condition, Midazolam and Aprepitant  Airway Management Planned: Oral ETT and Video Laryngoscope Planned  Additional Equipment: Arterial line  Intra-op Plan:   Post-operative Plan: Extubation in OR  Informed Consent: I have reviewed the patients History and Physical, chart, labs and discussed the procedure including the risks, benefits and alternatives for the proposed anesthesia with the patient or authorized  representative who has indicated his/her understanding and acceptance.     Dental advisory given  Plan Discussed with: CRNA  Anesthesia Plan Comments: (  PAT note written 12/12/2021 by Myra Gianotti, PA-C. )    Anesthesia Quick Evaluation

## 2021-12-12 NOTE — Progress Notes (Signed)
Anesthesia Chart Review:  Case: [id number] Date/Time: 12/16/21 0715   Procedure: C4-5; C5-6; C6-7 ACDF - 3C/RM 21   Anesthesia type: General   Pre-op diagnosis: Cervical Myelopathy   Location: [facility] OR ROOM 21 / Why OR   Surgeons: Judith Part, MD       DISCUSSION: Patient is a 75 year old female scheduled for the above procedure. Dx: Cervical myelopathy. She was referred to Dr. Zada Finders by her neurologist Dr. Carles Collet for hyperreflexia and multiple falls with severe central canal stenosis, worse at C4-5 and C5-6 on MRI.   History includes former smoker, post-operative N/V, DM2, HTN, hypercholesterolemia, dyspnea, fatty liver disease, Parkinson's disease (diagnosed 04/15/21), skin cancer (B[facility]).   Last saw pulmonologist Dr. Elsworth Soho on 10/14/21.  "Dyspnea on exertion.Marland KitchenMarland KitchenUnclear cause.  HRCT seem to suggest mild ILD but repeat CT chest shows mosaic pattern more consistent with small vessel disease or bronchiolitis PFTs have shown mild restriction, serologies negative.  Not fully convinced that this is ILD.  Best to take a wait and watch approach , we will reassess in 6 months with HRCT for disease progression. Meanwhile have encouraged her to continue rehab program. She is cleared for colonoscopy procedure. She would also be cleared for neck surgery if needed".   12/12/21 EKG showed SR, RBBB, LAFB, bifascicular block at 87 bpm. No chest pain reported. By notes, this summer had been doing cycling classes 2 days per week at the [facility]. She does have chronic dyspnea that is followed by pulmonology (established 4/20/  22) and reported as at her baseline. Echo reportedly done as part of work-up per her PCP Dr. Kelton Pillar 09/2020. Results showed LVEF 48-54%, mild diastolic dysfunction, no significant valvular abnormalities. Dr. Laurann Montana has since retired. I contacted [facility] at [facility] and was told there are no EKGs on file there for comparison.   Preoperative labs showed A1c 6.0, H/H 11.9/36.9, PLT 218,  12/12/21 preoperative COVID-19 test is in process. Anesthesia team to evaluate on the day of surgery.    VS: BP (P) 114/77    Pulse (!) (P) 102    Temp (P) 36.4 C (Oral)    Resp (P) 18    Ht (P) 5' (1.524 m)    Wt (P) 70.4 kg    SpO2 (P) 97%    BMI (P) 30.29 kg/m  HR 87 bpm on 12/12/21 EKG.    PROVIDERSKelton Pillar, MD is PCP  Kara Mead, MD is pulmonlogist Tat, Wells Guiles, DO is neurologist. Last visit 07/18/21. Continue Carbidopa/levodopa 25/100 TID for Parkinson's.    LABS: Labs reviewed: Acceptable for surgery. (all labs ordered are listed, but only abnormal results are displayed)  Labs Reviewed  GLUCOSE, CAPILLARY - Abnormal; Notable for the following components:      Result Value   Glucose-Capillary 147 (*)    All other components within normal limits  HEMOGLOBIN A1C - Abnormal; Notable for the following components:   Hgb A1c MFr Bld 6.0 (*)    All other components within normal limits  COMPREHENSIVE METABOLIC PANEL - Abnormal; Notable for the following components:   Glucose, Bld 114 (*)    AST 43 (*)    GFR, Estimated 59 (*)    All other components within normal limits  CBC - Abnormal; Notable for the following components:   Hemoglobin 11.9 (*)    All other components within normal limits  SURGICAL PCR SCREEN  SARS CORONAVIRUS 2 (TAT 6-24 HRS)  TYPE AND SCREEN    PFTs 05/07/21: "PFTs  04/2021 moderate restriction with no airflow obstruction.  FEV1 was 69%, ratio 85, FVC 60%, DLCO 108%.,  No significant bronchodilator response."   IMAGES: CT Chest 09/03/21: IMPRESSION: - Diffuse areas of mosaic attenuation in the chest, may represent areas of air trapping, correlate with any clinical signs of small airways disease and is seen in the setting of bronchial wall thickening which could reflect diffuse bronchitis. - LEFT upper lobe nodule with cavitary features may be related to chronic infection but remains indeterminate but is unchanged as compared to May 22, 2021.  Consider a six-month follow-up evaluation to ensure stability, high-resolution chest CT could also be performed at a 1 year interval from the June 2nd evaluation to assess for any signs of interstitial lung disease as outlined in the initial report. - Small hiatal hernia. - Midthoracic compression fractures and osteopenia, unchanged. - Aortic Atherosclerosis (ICD10-I70.0).   MRI C-spine 04/28/21 ([facility]): IMPRESSION:  - Severe degenerative changes of the cervical spine most prominent at C4-C5 and C5-C6.  - Incidentally noted 4 mm CSF isointense lesion within the posterior pituitary which could represent a Rathke's cleft cyst. This would be better evaluated with dedicated pituitary protocol imaging.    MRI Head 04/28/21 ([facility]): IMPRESSION:  No evidence of acute intracranial abnormality.   Sniff test 04/14/21: IMPRESSION: Normal sniff test.   EKG: Normal sinus rhythm Right bundle branch block Left anterior fascicular block ** Bifascicular block ** Minimal voltage criteria for LVH, may be normal variant ( R in aVL ) Septal infarct , age undetermined Abnormal ECG - Currently no available comparison EKGs in [place], [place], or at [facility].   CV: Echo 09/25/20: Impression: Mild LVH. LVEF 50-55%. Possible diastolic dysfunction. Normal RV size and systolic function. RA not well visualized.   Reported prior stress test > 10 years ago.   Past Medical History:  Diagnosis Date   Cancer ([place])    Basal Cell Carcinoma   Complication of anesthesia    Depression    Diabetes mellitus without complication ([place])    Dyspnea    Pt sees pulm-Dr. Elsworth Soho   Fatty liver    High cholesterol    Hypertension    Parkinson disease (HCC)    PONV (postoperative nausea and vomiting)    After a colonoscopy procedure x1    Past Surgical History:  Procedure Laterality Date   CATARACT EXTRACTION, BILATERAL      MEDICATIONS:  carbidopa-levodopa (SINEMET IR) 25-100 MG tablet   albuterol  (VENTOLIN HFA) 108 (90 Base) MCG/ACT inhaler   atorvastatin (LIPITOR) 40 MG tablet   budesonide-formoterol (SYMBICORT) 80-4.5 MCG/ACT inhaler   buPROPion (WELLBUTRIN XL) 150 MG 24 hr tablet   Calcium Carbonate-Vitamin D 600-5 MG-MCG TABS   pantoprazole (PROTONIX) 20 MG tablet   sertraline (ZOLOFT) 100 MG tablet   valsartan-hydrochlorothiazide (DIOVAN-HCT) 160-12.5 MG tablet   No current facility-administered medications for this encounter.    Myra Gianotti, PA-C Surgical Short Stay/Anesthesiology [facility] Phone [phone] [facility] Phone [phone] 12/12/2021 1:48 PM

## 2021-12-12 NOTE — Progress Notes (Signed)
PCP - Dr. Kelton Pillar Cardiologist - denies Pulm- Dr. Elsworth Soho  PPM/ICD -n/a   Chest x-ray - 04/09/21 EKG - 12/12/21 Stress Test - 10+ years ago ECHO - 09/25/20 Cardiac Cath - denies  Sleep Study - denies CPAP - denies  CBG at PAT 147. Will check A1C today. Checks Blood Sugar 1 times a month, if that.   Blood Thinner Instructions: n/a Aspirin Instructions: n/a  ERAS Protcol -Clear liquids until 0430 DOS PRE-SURGERY Ensure or G2- none ordered.  COVID TEST- 12/12/21, done in PAT   Anesthesia review: Yes, lung history.  Patient denies shortness of breath, fever, cough and chest pain at PAT appointment   All instructions explained to the patient, with a verbal understanding of the material. Patient agrees to go over the instructions while at home for a better understanding. Patient also instructed to self quarantine after being tested for COVID-19. The opportunity to ask questions was provided.

## 2021-12-16 ENCOUNTER — Encounter (HOSPITAL_COMMUNITY)
Admission: RE | Disposition: A | Discharge status: Home Health | Payer: Self-pay | Source: Home / Self Care | Attending: Neurological Surgery

## 2021-12-16 ENCOUNTER — Inpatient Hospital Stay (HOSPITAL_COMMUNITY): Payer: Medicare Other

## 2021-12-16 ENCOUNTER — Inpatient Hospital Stay (HOSPITAL_COMMUNITY): Payer: Medicare Other | Admitting: Anesthesiology

## 2021-12-16 ENCOUNTER — Inpatient Hospital Stay (HOSPITAL_COMMUNITY)
Admission: RE | Admit: 2021-12-16 | Discharge: 2021-12-17 | DRG: 472 | Disposition: A | Discharge status: Home Health | Payer: Medicare Other | Attending: Neurological Surgery | Admitting: Neurological Surgery

## 2021-12-16 ENCOUNTER — Inpatient Hospital Stay (HOSPITAL_COMMUNITY): Payer: Medicare Other | Admitting: Vascular Surgery

## 2021-12-16 DIAGNOSIS — G959 Disease of spinal cord, unspecified: Secondary | ICD-10-CM | POA: Diagnosis present

## 2021-12-16 DIAGNOSIS — Z833 Family history of diabetes mellitus: Secondary | ICD-10-CM

## 2021-12-16 DIAGNOSIS — G20 Parkinson's disease: Secondary | ICD-10-CM | POA: Diagnosis present

## 2021-12-16 DIAGNOSIS — Z85828 Personal history of other malignant neoplasm of skin: Secondary | ICD-10-CM

## 2021-12-16 DIAGNOSIS — E7800 Pure hypercholesterolemia, unspecified: Secondary | ICD-10-CM | POA: Diagnosis present

## 2021-12-16 DIAGNOSIS — Z981 Arthrodesis status: Secondary | ICD-10-CM | POA: Diagnosis not present

## 2021-12-16 DIAGNOSIS — G992 Myelopathy in diseases classified elsewhere: Secondary | ICD-10-CM | POA: Diagnosis not present

## 2021-12-16 DIAGNOSIS — K760 Fatty (change of) liver, not elsewhere classified: Secondary | ICD-10-CM | POA: Diagnosis not present

## 2021-12-16 DIAGNOSIS — M4802 Spinal stenosis, cervical region: Principal | ICD-10-CM | POA: Diagnosis present

## 2021-12-16 DIAGNOSIS — Z419 Encounter for procedure for purposes other than remedying health state, unspecified: Secondary | ICD-10-CM

## 2021-12-16 DIAGNOSIS — G9589 Other specified diseases of spinal cord: Secondary | ICD-10-CM | POA: Diagnosis not present

## 2021-12-16 DIAGNOSIS — Z87891 Personal history of nicotine dependence: Secondary | ICD-10-CM | POA: Diagnosis not present

## 2021-12-16 DIAGNOSIS — M4322 Fusion of spine, cervical region: Secondary | ICD-10-CM | POA: Diagnosis not present

## 2021-12-16 DIAGNOSIS — E119 Type 2 diabetes mellitus without complications: Secondary | ICD-10-CM | POA: Diagnosis not present

## 2021-12-16 DIAGNOSIS — I10 Essential (primary) hypertension: Secondary | ICD-10-CM | POA: Diagnosis not present

## 2021-12-16 HISTORY — PX: ANTERIOR CERVICAL DECOMP/DISCECTOMY FUSION: SHX1161

## 2021-12-16 LAB — GLUCOSE, CAPILLARY
Glucose-Capillary: 125 mg/dL — ABNORMAL HIGH (ref 70–99)
Glucose-Capillary: 149 mg/dL — ABNORMAL HIGH (ref 70–99)
Glucose-Capillary: 157 mg/dL — ABNORMAL HIGH (ref 70–99)
Glucose-Capillary: 157 mg/dL — ABNORMAL HIGH (ref 70–99)

## 2021-12-16 LAB — ABO/RH: ABO/RH(D): A POS

## 2021-12-16 SURGERY — ANTERIOR CERVICAL DECOMPRESSION/DISCECTOMY FUSION 3 LEVELS
Anesthesia: General

## 2021-12-16 MED ORDER — ORAL CARE MOUTH RINSE: 15.0000 mL | Freq: Once | OROMUCOSAL | Status: AC

## 2021-12-16 MED ORDER — CHLORHEXIDINE GLUCONATE CLOTH 2 % EX PADS: 6.0000 | MEDICATED_PAD | Freq: Once | CUTANEOUS | Status: DC

## 2021-12-16 MED ORDER — PHENYLEPHRINE HCL (PRESSORS) 10 MG/ML IV SOLN
INTRAVENOUS | Status: DC | PRN
  Administered 2021-12-16: 40 ug via INTRAVENOUS
  Administered 2021-12-16: 80 ug via INTRAVENOUS

## 2021-12-16 MED ORDER — PROMETHAZINE HCL 25 MG/ML IJ SOLN: 6.2500 mg | INTRAMUSCULAR | Status: DC | PRN

## 2021-12-16 MED ORDER — DOCUSATE SODIUM 100 MG PO CAPS
100.0000 mg | ORAL_CAPSULE | Freq: Two times a day (BID) | ORAL | Status: DC
  Administered 2021-12-16 – 2021-12-17 (×3): 100 mg via ORAL
  Filled 2021-12-16 (×3): qty 1

## 2021-12-16 MED ORDER — CEFAZOLIN SODIUM-DEXTROSE 2-4 GM/100ML-% IV SOLN
2.0000 g | INTRAVENOUS | Status: AC
  Administered 2021-12-16: 08:00:00 2 g via INTRAVENOUS

## 2021-12-16 MED ORDER — ACETAMINOPHEN 650 MG RE SUPP: 650.0000 mg | RECTAL | Status: DC | PRN

## 2021-12-16 MED ORDER — SUFENTANIL CITRATE 50 MCG/ML IV SOLN
INTRAVENOUS | Status: AC
  Filled 2021-12-16: qty 1

## 2021-12-16 MED ORDER — OXYCODONE HCL 5 MG PO TABS
5.0000 mg | ORAL_TABLET | ORAL | Status: DC | PRN
  Administered 2021-12-16: 21:00:00 5 mg via ORAL
  Filled 2021-12-16: qty 1

## 2021-12-16 MED ORDER — ACETAMINOPHEN 325 MG PO TABS
650.0000 mg | ORAL_TABLET | ORAL | Status: DC | PRN
  Administered 2021-12-16 – 2021-12-17 (×3): 650 mg via ORAL
  Filled 2021-12-16 (×3): qty 2

## 2021-12-16 MED ORDER — PROPOFOL 10 MG/ML IV BOLUS
INTRAVENOUS | Status: AC
  Filled 2021-12-16: qty 20

## 2021-12-16 MED ORDER — SUFENTANIL CITRATE 50 MCG/ML IV SOLN
INTRAVENOUS | Status: DC | PRN
  Administered 2021-12-16 (×2): 10 ug via INTRAVENOUS

## 2021-12-16 MED ORDER — CHLORHEXIDINE GLUCONATE 0.12 % MT SOLN: 15.0000 mL | Freq: Once | OROMUCOSAL | Status: AC

## 2021-12-16 MED ORDER — ONDANSETRON HCL 4 MG PO TABS: 4.0000 mg | ORAL_TABLET | Freq: Four times a day (QID) | ORAL | Status: DC | PRN

## 2021-12-16 MED ORDER — ALBUTEROL SULFATE HFA 108 (90 BASE) MCG/ACT IN AERS: 1.0000 | INHALATION_SPRAY | Freq: Four times a day (QID) | RESPIRATORY_TRACT | Status: DC | PRN

## 2021-12-16 MED ORDER — HYDROCHLOROTHIAZIDE 12.5 MG PO TABS
12.5000 mg | ORAL_TABLET | Freq: Every day | ORAL | Status: DC
  Administered 2021-12-17: 09:00:00 12.5 mg via ORAL
  Filled 2021-12-16: qty 1

## 2021-12-16 MED ORDER — IRBESARTAN 150 MG PO TABS
150.0000 mg | ORAL_TABLET | Freq: Every day | ORAL | Status: DC
  Administered 2021-12-17: 09:00:00 150 mg via ORAL
  Filled 2021-12-16: qty 1

## 2021-12-16 MED ORDER — THROMBIN 5000 UNITS EX SOLR
CUTANEOUS | Status: AC
  Filled 2021-12-16: qty 5000

## 2021-12-16 MED ORDER — SODIUM CHLORIDE (PF) 0.9 % IJ SOLN
INTRAMUSCULAR | Status: AC
  Filled 2021-12-16: qty 10

## 2021-12-16 MED ORDER — DEXAMETHASONE SODIUM PHOSPHATE 10 MG/ML IJ SOLN
INTRAMUSCULAR | Status: AC
  Filled 2021-12-16: qty 1

## 2021-12-16 MED ORDER — POLYETHYLENE GLYCOL 3350 17 G PO PACK: 17.0000 g | PACK | Freq: Every day | ORAL | Status: DC | PRN

## 2021-12-16 MED ORDER — LIDOCAINE 2% (20 MG/ML) 5 ML SYRINGE
INTRAMUSCULAR | Status: AC
  Filled 2021-12-16: qty 5

## 2021-12-16 MED ORDER — ALBUTEROL SULFATE (2.5 MG/3ML) 0.083% IN NEBU: 2.5000 mg | INHALATION_SOLUTION | Freq: Four times a day (QID) | RESPIRATORY_TRACT | Status: DC | PRN

## 2021-12-16 MED ORDER — LIDOCAINE-EPINEPHRINE 1 %-1:100000 IJ SOLN
INTRAMUSCULAR | Status: AC
  Filled 2021-12-16: qty 1

## 2021-12-16 MED ORDER — LIDOCAINE HCL (CARDIAC) PF 100 MG/5ML IV SOSY
PREFILLED_SYRINGE | INTRAVENOUS | Status: DC | PRN
  Administered 2021-12-16: 100 mg via INTRATRACHEAL

## 2021-12-16 MED ORDER — ACETAMINOPHEN 500 MG PO TABS: 1000.0000 mg | ORAL_TABLET | Freq: Once | ORAL | Status: AC

## 2021-12-16 MED ORDER — CARBIDOPA-LEVODOPA 25-100 MG PO TABS
1.0000 | ORAL_TABLET | Freq: Three times a day (TID) | ORAL | Status: DC
  Administered 2021-12-16 – 2021-12-17 (×3): 1 via ORAL
  Filled 2021-12-16 (×5): qty 1

## 2021-12-16 MED ORDER — SODIUM CHLORIDE 0.9 % IV SOLN: 250.0000 mL | INTRAVENOUS | Status: DC

## 2021-12-16 MED ORDER — ONDANSETRON HCL 4 MG/2ML IJ SOLN
INTRAMUSCULAR | Status: DC | PRN
  Administered 2021-12-16: 4 mg via INTRAVENOUS

## 2021-12-16 MED ORDER — ONDANSETRON HCL 4 MG/2ML IJ SOLN: 4.0000 mg | Freq: Four times a day (QID) | INTRAMUSCULAR | Status: DC | PRN

## 2021-12-16 MED ORDER — OXYCODONE HCL 5 MG PO TABS: 10.0000 mg | ORAL_TABLET | ORAL | Status: DC | PRN

## 2021-12-16 MED ORDER — VALSARTAN-HYDROCHLOROTHIAZIDE 160-12.5 MG PO TABS: 1.0000 | ORAL_TABLET | Freq: Every day | ORAL | Status: DC

## 2021-12-16 MED ORDER — LACTATED RINGERS IV SOLN: INTRAVENOUS | Status: DC

## 2021-12-16 MED ORDER — APREPITANT 40 MG PO CAPS: 40.0000 mg | ORAL_CAPSULE | Freq: Once | ORAL | Status: AC

## 2021-12-16 MED ORDER — SERTRALINE HCL 50 MG PO TABS
100.0000 mg | ORAL_TABLET | Freq: Every day | ORAL | Status: DC
  Administered 2021-12-16 – 2021-12-17 (×2): 100 mg via ORAL
  Filled 2021-12-16 (×2): qty 2

## 2021-12-16 MED ORDER — MENTHOL 3 MG MT LOZG: 1.0000 | LOZENGE | OROMUCOSAL | Status: DC | PRN

## 2021-12-16 MED ORDER — MOMETASONE FURO-FORMOTEROL FUM 100-5 MCG/ACT IN AERO
2.0000 | INHALATION_SPRAY | Freq: Two times a day (BID) | RESPIRATORY_TRACT | Status: DC
  Administered 2021-12-16 – 2021-12-17 (×2): 2 via RESPIRATORY_TRACT
  Filled 2021-12-16: qty 8.8

## 2021-12-16 MED ORDER — SODIUM CHLORIDE 0.9% FLUSH
3.0000 mL | Freq: Two times a day (BID) | INTRAVENOUS | Status: DC
  Administered 2021-12-17: 02:00:00 3 mL via INTRAVENOUS

## 2021-12-16 MED ORDER — MIDAZOLAM HCL 2 MG/2ML IJ SOLN
INTRAMUSCULAR | Status: AC
  Filled 2021-12-16: qty 2

## 2021-12-16 MED ORDER — LACTATED RINGERS IV SOLN: INTRAVENOUS | Status: DC | PRN

## 2021-12-16 MED ORDER — DEXAMETHASONE SODIUM PHOSPHATE 10 MG/ML IJ SOLN
INTRAMUSCULAR | Status: DC | PRN
  Administered 2021-12-16: 10 mg via INTRAVENOUS

## 2021-12-16 MED ORDER — ROCURONIUM BROMIDE 10 MG/ML (PF) SYRINGE
PREFILLED_SYRINGE | INTRAVENOUS | Status: AC
  Filled 2021-12-16: qty 10

## 2021-12-16 MED ORDER — SUGAMMADEX SODIUM 200 MG/2ML IV SOLN
INTRAVENOUS | Status: DC | PRN
  Administered 2021-12-16: 200 mg via INTRAVENOUS

## 2021-12-16 MED ORDER — ALBUMIN HUMAN 5 % IV SOLN: INTRAVENOUS | Status: DC | PRN

## 2021-12-16 MED ORDER — ROCURONIUM 10MG/ML (10ML) SYRINGE FOR MEDFUSION PUMP - OPTIME
INTRAVENOUS | Status: DC | PRN
  Administered 2021-12-16: 100 mg via INTRAVENOUS

## 2021-12-16 MED ORDER — APREPITANT 40 MG PO CAPS
ORAL_CAPSULE | ORAL | Status: AC
  Administered 2021-12-16: 06:00:00 40 mg via ORAL
  Filled 2021-12-16: qty 1

## 2021-12-16 MED ORDER — PANTOPRAZOLE SODIUM 20 MG PO TBEC
20.0000 mg | DELAYED_RELEASE_TABLET | Freq: Every day | ORAL | Status: DC
  Administered 2021-12-17: 09:00:00 20 mg via ORAL
  Filled 2021-12-16: qty 1

## 2021-12-16 MED ORDER — CHLORHEXIDINE GLUCONATE 0.12 % MT SOLN
OROMUCOSAL | Status: AC
  Administered 2021-12-16: 06:00:00 15 mL via OROMUCOSAL
  Filled 2021-12-16: qty 15

## 2021-12-16 MED ORDER — BUPROPION HCL ER (XL) 150 MG PO TB24
150.0000 mg | ORAL_TABLET | Freq: Every day | ORAL | Status: DC
  Administered 2021-12-16 – 2021-12-17 (×2): 150 mg via ORAL
  Filled 2021-12-16 (×2): qty 1

## 2021-12-16 MED ORDER — SODIUM CHLORIDE 0.9% FLUSH: 3.0000 mL | INTRAVENOUS | Status: DC | PRN

## 2021-12-16 MED ORDER — PHENYLEPHRINE 40 MCG/ML (10ML) SYRINGE FOR IV PUSH (FOR BLOOD PRESSURE SUPPORT)
PREFILLED_SYRINGE | INTRAVENOUS | Status: AC
  Filled 2021-12-16: qty 10

## 2021-12-16 MED ORDER — MEPERIDINE HCL 25 MG/ML IJ SOLN: 6.2500 mg | INTRAMUSCULAR | Status: DC | PRN

## 2021-12-16 MED ORDER — THROMBIN 5000 UNITS EX SOLR
OROMUCOSAL | Status: DC | PRN
  Administered 2021-12-16: 08:00:00 5 mL via TOPICAL

## 2021-12-16 MED ORDER — ONDANSETRON HCL 4 MG/2ML IJ SOLN
INTRAMUSCULAR | Status: AC
  Filled 2021-12-16: qty 2

## 2021-12-16 MED ORDER — CEFAZOLIN SODIUM-DEXTROSE 2-4 GM/100ML-% IV SOLN
2.0000 g | Freq: Three times a day (TID) | INTRAVENOUS | Status: AC
  Administered 2021-12-16 – 2021-12-17 (×2): 2 g via INTRAVENOUS
  Filled 2021-12-16 (×2): qty 100

## 2021-12-16 MED ORDER — 0.9 % SODIUM CHLORIDE (POUR BTL) OPTIME
TOPICAL | Status: DC | PRN
  Administered 2021-12-16: 08:00:00 1000 mL

## 2021-12-16 MED ORDER — PHENOL 1.4 % MT LIQD: 1.0000 | OROMUCOSAL | Status: DC | PRN

## 2021-12-16 MED ORDER — METHOCARBAMOL 500 MG PO TABS
500.0000 mg | ORAL_TABLET | Freq: Three times a day (TID) | ORAL | Status: DC | PRN
  Administered 2021-12-16: 19:00:00 500 mg via ORAL
  Filled 2021-12-16: qty 1

## 2021-12-16 MED ORDER — ACETAMINOPHEN 500 MG PO TABS
ORAL_TABLET | ORAL | Status: AC
  Administered 2021-12-16: 06:00:00 1000 mg via ORAL
  Filled 2021-12-16: qty 2

## 2021-12-16 MED ORDER — PROPOFOL 10 MG/ML IV BOLUS
INTRAVENOUS | Status: DC | PRN
  Administered 2021-12-16: 110 mg via INTRAVENOUS

## 2021-12-16 MED ORDER — ATORVASTATIN CALCIUM 40 MG PO TABS
40.0000 mg | ORAL_TABLET | Freq: Every day | ORAL | Status: DC
  Administered 2021-12-16 – 2021-12-17 (×2): 40 mg via ORAL
  Filled 2021-12-16 (×2): qty 1

## 2021-12-16 MED ORDER — HYDROMORPHONE HCL 1 MG/ML IJ SOLN
1.0000 mg | INTRAMUSCULAR | Status: DC | PRN
  Filled 2021-12-16: qty 1

## 2021-12-16 MED ORDER — HYDROMORPHONE HCL 1 MG/ML IJ SOLN: 0.2500 mg | INTRAMUSCULAR | Status: DC | PRN

## 2021-12-16 MED ORDER — PHENYLEPHRINE HCL-NACL 20-0.9 MG/250ML-% IV SOLN
INTRAVENOUS | Status: DC | PRN
  Administered 2021-12-16: 50 ug/min via INTRAVENOUS

## 2021-12-16 MED ORDER — LIDOCAINE-EPINEPHRINE 1 %-1:100000 IJ SOLN
INTRAMUSCULAR | Status: DC | PRN
  Administered 2021-12-16: 10 mL

## 2021-12-16 MED ORDER — CEFAZOLIN SODIUM-DEXTROSE 2-4 GM/100ML-% IV SOLN
INTRAVENOUS | Status: AC
  Filled 2021-12-16: qty 100

## 2021-12-16 SURGICAL SUPPLY — 57 items
ADH SKN CLS APL DERMABOND .7 (GAUZE/BANDAGES/DRESSINGS) ×1
APL SKNCLS STERI-STRIP NONHPOA (GAUZE/BANDAGES/DRESSINGS)
BAG COUNTER SPONGE SURGICOUNT (BAG) ×4 IMPLANT
BAG SPNG CNTER NS LX DISP (BAG) ×2
BAND INSRT 18 STRL LF DISP RB (MISCELLANEOUS) ×2
BAND RUBBER #18 3X1/16 STRL (MISCELLANEOUS) ×6 IMPLANT
BENZOIN TINCTURE PRP APPL 2/3 (GAUZE/BANDAGES/DRESSINGS) IMPLANT
BLADE CLIPPER SURG (BLADE) IMPLANT
BLADE SURG 11 STRL SS (BLADE) ×3 IMPLANT
BUR MATCHSTICK NEURO 3.0 LAGG (BURR) ×3 IMPLANT
CANISTER SUCT 3000ML PPV (MISCELLANEOUS) ×3 IMPLANT
DECANTER SPIKE VIAL GLASS SM (MISCELLANEOUS) ×2 IMPLANT
DERMABOND ADVANCED (GAUZE/BANDAGES/DRESSINGS) ×1
DERMABOND ADVANCED .7 DNX12 (GAUZE/BANDAGES/DRESSINGS) ×2 IMPLANT
DRAPE C-ARM 42X72 X-RAY (DRAPES) ×6 IMPLANT
DRAPE HALF SHEET 40X57 (DRAPES) IMPLANT
DRAPE LAPAROTOMY 100X72 PEDS (DRAPES) ×3 IMPLANT
DRAPE MICROSCOPE LEICA (MISCELLANEOUS) ×3 IMPLANT
DURAPREP 6ML APPLICATOR 50/CS (WOUND CARE) ×4 IMPLANT
ELECT COATED BLADE 2.86 ST (ELECTRODE) ×3 IMPLANT
ELECT REM PT RETURN 9FT ADLT (ELECTROSURGICAL) ×2
ELECTRODE REM PT RTRN 9FT ADLT (ELECTROSURGICAL) ×2 IMPLANT
GAUZE 4X4 16PLY ~~LOC~~+RFID DBL (SPONGE) ×1 IMPLANT
GLOVE EXAM NITRILE LRG STRL (GLOVE) IMPLANT
GLOVE EXAM NITRILE XL STR (GLOVE) IMPLANT
GLOVE EXAM NITRILE XS STR PU (GLOVE) IMPLANT
GLOVE SURG LTX SZ7.5 (GLOVE) ×3 IMPLANT
GLOVE SURG UNDER POLY LF SZ7.5 (GLOVE) ×6 IMPLANT
GOWN STRL REUS W/ TWL LRG LVL3 (GOWN DISPOSABLE) ×4 IMPLANT
GOWN STRL REUS W/ TWL XL LVL3 (GOWN DISPOSABLE) IMPLANT
GOWN STRL REUS W/TWL 2XL LVL3 (GOWN DISPOSABLE) IMPLANT
GOWN STRL REUS W/TWL LRG LVL3 (GOWN DISPOSABLE) ×4
GOWN STRL REUS W/TWL XL LVL3 (GOWN DISPOSABLE)
HEMOSTAT POWDER KIT SURGIFOAM (HEMOSTASIS) ×3 IMPLANT
KIT BASIN OR (CUSTOM PROCEDURE TRAY) ×3 IMPLANT
KIT TURNOVER KIT B (KITS) ×3 IMPLANT
NDL SPNL 18GX3.5 QUINCKE PK (NEEDLE) ×2 IMPLANT
NEEDLE HYPO 22GX1.5 SAFETY (NEEDLE) ×3 IMPLANT
NEEDLE SPNL 18GX3.5 QUINCKE PK (NEEDLE) ×2 IMPLANT
NS IRRIG 1000ML POUR BTL (IV SOLUTION) ×3 IMPLANT
PACK LAMINECTOMY NEURO (CUSTOM PROCEDURE TRAY) ×3 IMPLANT
PAD ARMBOARD 7.5X6 YLW CONV (MISCELLANEOUS) ×9 IMPLANT
PIN DISTRACTION 14MM (PIN) ×2 IMPLANT
PLATE ATLANTIS ELITE 52 (Plate) ×1 IMPLANT
SCREW 4.0X13 (Screw) ×16 IMPLANT
SCREW BN 13X4XSLF DRL FXANG (Screw) IMPLANT
SPACER BONE CORNERSTONE 6X14 (Orthopedic Implant) ×3 IMPLANT
SPONGE INTESTINAL PEANUT (DISPOSABLE) ×3 IMPLANT
SPONGE T-LAP 4X18 ~~LOC~~+RFID (SPONGE) ×1 IMPLANT
STAPLER VISISTAT 35W (STAPLE) IMPLANT
SUT MNCRL AB 3-0 PS2 18 (SUTURE) ×3 IMPLANT
SUT VIC AB 3-0 SH 8-18 (SUTURE) ×4 IMPLANT
TAPE CLOTH 3X10 TAN LF (GAUZE/BANDAGES/DRESSINGS) ×2 IMPLANT
TOWEL GREEN STERILE (TOWEL DISPOSABLE) ×3 IMPLANT
TOWEL GREEN STERILE FF (TOWEL DISPOSABLE) ×3 IMPLANT
TRAY FOLEY MTR SLVR 16FR STAT (SET/KITS/TRAYS/PACK) ×3 IMPLANT
WATER STERILE IRR 1000ML POUR (IV SOLUTION) ×3 IMPLANT

## 2021-12-16 NOTE — Anesthesia Procedure Notes (Signed)
Procedure Name: Intubation Date/Time: 12/16/2021 7:46 AM Performed by: Claris Che, CRNA Pre-anesthesia Checklist: Patient identified, Emergency Drugs available, Suction available, Patient being monitored and Timeout performed Patient Re-evaluated:Patient Re-evaluated prior to induction Oxygen Delivery Method: Circle system utilized Preoxygenation: Pre-oxygenation with 100% oxygen Induction Type: Cricoid Pressure applied Ventilation: Mask ventilation without difficulty Laryngoscope Size: Glidescope Grade View: Grade II Tube type: Oral Tube size: 7.5 mm Number of attempts: 1 Airway Equipment and Method: Stylet Placement Confirmation: ETT inserted through vocal cords under direct vision, positive ETCO2 and breath sounds checked- equal and bilateral Secured at: 22 cm Tube secured with: Tape Dental Injury: Teeth and Oropharynx as per pre-operative assessment

## 2021-12-16 NOTE — Transfer of Care (Signed)
Immediate Anesthesia Transfer of Care Note  Patient: Briana [name]  Procedure(s) Performed: Cervical Four-Five; Cervical Five-Six; Cervical Six-Seven  Anterior Cervical Decompression Fusion  Patient Location: PACU  Anesthesia Type:General  Level of Consciousness: drowsy, patient cooperative and responds to stimulation  Airway & Oxygen Therapy: Patient Spontanous Breathing and Patient connected to nasal cannula oxygen  Post-op Assessment: Report given to RN, Post -op Vital signs reviewed and stable and Patient moving all extremities X 4  Post vital signs: Reviewed and stable  Last Vitals:  Vitals Value Taken Time  BP 98/71 12/16/21 1107  Temp    Pulse 87 12/16/21 1110  Resp 15 12/16/21 1110  SpO2 95 % 12/16/21 1110  Vitals shown include unvalidated device data.  Last Pain:  Vitals:   12/16/21 0615  TempSrc: Oral  PainSc: 0-No pain         Complications: No notable events documented.

## 2021-12-16 NOTE — H&P (Signed)
Surgical H&P Update  HPI: 75 y.o. woman w/ h/o PD and cervical myelopathy, here for ACDF. Initial symptoms consisted of progressive fine motor issues bilaterally with hand weakness and gait issues.  Radiographic workup revealed cervical stenosis with some cord signal change. No changes in health since she was last seen. Still having the above and wishes to proceed with surgery.  PMHx:  Past Medical History:  Diagnosis Date   Cancer ([place])    Basal Cell Carcinoma   Complication of anesthesia    Depression    Diabetes mellitus without complication ([place])    Dyspnea    Pt sees pulm-Dr. Elsworth Soho   Fatty liver    High cholesterol    Hypertension    Parkinson disease (HCC)    PONV (postoperative nausea and vomiting)    After a colonoscopy procedure x1   FamHx:  Family History  Problem Relation Age of Onset   Cancer Mother        vulvar   Pancreatic cancer Father    Diabetes Maternal Grandmother    Diabetes Child    SocHx:  reports that she has quit smoking. She has never used smokeless tobacco. She reports that she does not drink alcohol and does not use drugs.  Physical Exam: AOx3, PERRL, FS, TM  Strength 4+/5 x4, SILTx4, +bilateral hoffman's, increased reflexes x4, improved bradykinesia / tremor compared to last exam  Assesment/Plan: 75 y.o. woman with PD and cervical myelopathy with cord signal change, here for 3 level ACDF. Risks, benefits, and alternatives discussed and the patient would like to continue with surgery.  -OR today -3W post-op  Judith Part, MD 12/17/75 7:19 AM

## 2021-12-16 NOTE — Op Note (Signed)
PATIENT: Briana [name]  PROCEDURE DATE: 12/16/21  PRE-OPERATIVE DIAGNOSIS:  Cervical myelopathy   POST-OPERATIVE DIAGNOSIS:  Same   PROCEDURE:  C4-C5, C5-6, C6-7 Anterior Cervical Discectomy and Instrumented Fusion   SURGEON:  Surgeon(s) and Role:    Judith Part, MD - Primary    Dawley, Pieter Partridge DO - Assisting   ANESTHESIA: ETGA   BRIEF HISTORY: This is a 75 year old woman who presented with new onset PD as well as symptoms of CSM. MRI showed 3 levels of stenosis with some subtle cord signal change. On exam, she had some signs of PD with a unilateral tremor and bradykinesia, which improved w/ levodopa therapy. However, her hand clumsiness and signs of CSM did not improve. I therefore recommended an ACDF at the stenotic levels to decompress her cord. This was discussed with the patient as well as risks, benefits, and alternatives and the patient wished to proceed with surgical treatment.   OPERATIVE DETAIL: The patient was taken to the operating room and placed on the OR table in the supine position. A formal time out was performed with two patient identifiers and confirmed the operative site. Anesthesia was induced by the anesthesia team.  Fluoroscopy was used to localize the surgical level and an incision was marked in a skin crease. The area was then prepped and draped in a sterile fashion. A transverse linear incision was made on the right side of the neck. The platysma was divided and the sternocleidomastoid muscle was identified. The carotid sheath was palpated, identified, and retracted laterally with the sternocleidomastoid muscle. The strap muscles were identified and retracted medially and the pretracheal fascia was entered. A bent spinal needle was used with fluoroscopy to localize the surgical level after dissection. The longus colli were elevated bilaterally and a self-retaining retractor was placed. The endotracheal tube cuff balloon was deflated and reinflated after retractor  placement.   Anterior osteophytes were removed until flush with the anterior vertebral body. The disc annulus was incised and a complete C4-C5 discectomy was performed. The posterior longitudinal ligament was incised followed by ligamentous and bony removal until no central canal stenosis was present. Decompression was then taken out laterally into the bilateral foramina until no foraminal stenosis was palpable. A 63mm cortical allograft (Medtronic) was inserted into the disc space as an interbody graft. Attention was turned to the next level. Of note, at each level, the endplates oozed and, after decompression, the decompressed epidural veins bled fairly profusely, all controlled with floseal and gentle pressure.   Discectomies were then performed in the same fashion with the same technique at C5-6 and C6-7 with 55mm cortical allograft interbodies at each level.  An anterior plate (Medtronic) was positioned to span C4-5, C5-6, C6-7 and confirmed with fluoroscopy. I used the smallest available plate and still had to aim the caudal screws upward to get them into the C6 and C7 vertebral bodies and fluoroscopy showed the plate as cranial as possible without violating the C3-4 disc space. Eight, 32mm screws were used to secure the plate to the C4, C5, C6, and C7 vertebral bodies. Hemostasis was obtained and the incision was closed in layers. All instrument and sponge counts were correct. The patient was then returned to anesthesia for emergence. No apparent complications at the completion of the procedure.   EBL:  530mL   DRAINS: none   SPECIMENS: none   Judith Part, MD 12/16/21 7:29 AM

## 2021-12-16 NOTE — Anesthesia Procedure Notes (Signed)
Arterial Line Insertion Start/End12/27/2022 7:20 AM, 12/16/2021 7:22 AM Performed by: Claris Che, CRNA, CRNA  Patient location: Pre-op. Preanesthetic checklist: patient identified, IV checked, risks and benefits discussed and pre-op evaluation Lidocaine 1% used for infiltration Right, radial was placed Catheter size: 20 G Hand hygiene performed  and maximum sterile barriers used   Attempts: 1 Procedure performed without using ultrasound guided technique. Following insertion, dressing applied and Biopatch. Post procedure assessment: normal  Patient tolerated the procedure well with no immediate complications.

## 2021-12-16 NOTE — Anesthesia Postprocedure Evaluation (Signed)
Anesthesia Post Note  Patient: Briana [name]  Procedure(s) Performed: Cervical Four-Five; Cervical Five-Six; Cervical Six-Seven  Anterior Cervical Decompression Fusion     Patient location during evaluation: PACU Anesthesia Type: General Level of consciousness: sedated and patient cooperative Pain management: pain level controlled Vital Signs Assessment: post-procedure vital signs reviewed and stable Respiratory status: spontaneous breathing Cardiovascular status: stable Anesthetic complications: no   No notable events documented.  Last Vitals:  Vitals:   12/16/21 1228 12/16/21 1546  BP: 113/67 135/78  Pulse: 91 95  Resp: 16 18  Temp: 36.8 C 36.9 C  SpO2: 93% 96%    Last Pain:  Vitals:   12/16/21 1546  TempSrc: Oral  PainSc:                  Nolon Nations

## 2021-12-17 ENCOUNTER — Encounter (HOSPITAL_COMMUNITY): Payer: Self-pay | Admitting: Neurological Surgery

## 2021-12-17 LAB — GLUCOSE, CAPILLARY: Glucose-Capillary: 118 mg/dL — ABNORMAL HIGH (ref 70–99)

## 2021-12-17 MED ORDER — OXYCODONE HCL 5 MG PO TABS: 5.0000 mg | ORAL_TABLET | ORAL | 0 refills | Status: DC | PRN

## 2021-12-17 NOTE — Discharge Summary (Signed)
Discharge Summary  Date of Admission: 12/16/2021  Date of Discharge: 12/17/21  Attending Physician: Emelda Brothers, MD  Hospital Course: Patient was admitted following an uncomplicated 3 level ACDF for myelopathy. She was recovered in PACU and transferred to [facility]. Her hospital course was uncomplicated and the patient was discharged home on 12/17/21. She will follow up in clinic with me in 2 weeks.  Neurologic exam at discharge:  Strength 5/5 x4, SILTx4 except some mild left median numbness, +mild hoffman's on R, none on left (improved from preop)  Discharge diagnosis: Cervical myelopathy  Judith Part, MD 12/17/21 7:55 AM

## 2021-12-17 NOTE — Evaluation (Signed)
Occupational Therapy Evaluation Patient Details Name: Briana [name] MRN: [id number] DOB: 1946-02-10 Today's Date: 12/17/2021   History of Present Illness 75 y.o. female presents to [facility] hospital on 12/16/2021 with unilateral tremor and bradykinesia. MRI demonstrates 3 levels of cervical stenosis with cord signal change. Pt underwent C4-7 ACDF on 12/16/2021. PMH includes depression, DMII, HTN, Parkinson disease.   Clinical Impression   Jakira was generally indep PTA, she lives alone in a multi-level home with 9 STE from her garage to the main living area of her home. Pt's daughter states that she can stay with the pt at d/c and assist as needed. Upon evaluation pt was limited by pain, generalized weakness and knowledge of her cervical precautions. Educated pt on all cervical precautions and she benefited from cues throughout completion of ADLs to adhere. Overall pt required min guard for all functional ambulation with RW, and up to min A for ADLs. She will benefit from [facility] at d/c to progress back to independence.    Recommendations for follow up therapy are one component of a multi-disciplinary discharge planning process, led by the attending physician.  Recommendations may be updated based on patient status, additional functional criteria and insurance authorization.   Follow Up Recommendations  Home health OT    Assistance Recommended at Discharge Frequent or constant Supervision/Assistance  Functional Status Assessment  Patient has had a recent decline in their functional status and demonstrates the ability to make significant improvements in function in a reasonable and predictable amount of time.  Equipment Recommendations   (RW)       Precautions / Restrictions Precautions Precautions: Fall;Cervical Precaution Booklet Issued: Yes (comment) Precaution Comments: reviewed cervical precautions Required Braces or Orthoses:  (no brace needed - pt's daughter inquiring about a soft collar  for comfort.) Restrictions Weight Bearing Restrictions: No      Mobility Bed Mobility Overal bed mobility: Needs Assistance Bed Mobility: Rolling;Sidelying to Sit;Sit to Sidelying Rolling: Min guard Sidelying to sit: Min assist     Sit to sidelying: Min assist General bed mobility comments: cues and assist for log roll technique    Transfers Overall transfer level: Needs assistance Equipment used: Rolling walker (2 wheels) Transfers: Sit to/from Stand Sit to Stand: Min guard           General transfer comment: from bed & from toilet      Balance Overall balance assessment: Needs assistance Sitting-balance support: Feet supported Sitting balance-Leahy Scale: Fair     Standing balance support: No upper extremity supported;During functional activity Standing balance-Leahy Scale: Fair Standing balance comment: standing at sink to groom                           ADL either performed or assessed with clinical judgement   ADL Overall ADL's : Needs assistance/impaired Eating/Feeding: Supervision/ safety;Sitting Eating/Feeding Details (indicate cue type and reason): vc for no bending Grooming: Wash/dry face;Oral care;Minimal assistance;Standing Grooming Details (indicate cue type and reason): min A for compensatory techniques Upper Body Bathing: Minimal assistance;Sitting   Lower Body Bathing: Minimal assistance;Sit to/from stand   Upper Body Dressing : Min guard;Sitting   Lower Body Dressing: Minimal assistance;Sit to/from stand   Toilet Transfer: Min guard;Ambulation;Rolling walker (2 wheels)   Toileting- Clothing Manipulation and Hygiene: Min guard;Sitting/lateral lean       Functional mobility during ADLs: Min guard;Rolling walker (2 wheels) General ADL Comments: verbal cues throughout for cervical precautions and compensatory techniques  Vision Baseline Vision/History: 1 Wears glasses Ability to See in Adequate Light: 0 Adequate Patient  Visual Report: No change from baseline Vision Assessment?: No apparent visual deficits     Perception Perception Perception: Not tested   Praxis Praxis Praxis: Not tested    Pertinent Vitals/Pain Pain Assessment: Faces Faces Pain Scale: Hurts even more Pain Location: back of neck Pain Descriptors / Indicators: Discomfort Pain Intervention(s): Monitored during session;Limited activity within patient's tolerance        Extremity/Trunk Assessment Upper Extremity Assessment Upper Extremity Assessment: Generalized weakness;LUE deficits/detail;RUE deficits/detail RUE Deficits / Details: limited over head ROM due to an old injury LUE Deficits / Details: generally 3+/5, new numbness in digits LUE Sensation: decreased light touch LUE Coordination: decreased fine motor;decreased gross motor   Lower Extremity Assessment Lower Extremity Assessment: Defer to PT evaluation   Cervical / Trunk Assessment Cervical / Trunk Assessment: Neck Surgery   Communication Communication Communication: No difficulties   Cognition Arousal/Alertness: Awake/alert Behavior During Therapy: WFL for tasks assessed/performed;Flat affect Overall Cognitive Status: Within Functional Limits for tasks assessed                 General Comments: requires verbal cues to maintain cervical precautions during ADLs     General Comments  VSS on RA, pt's daughter present and supportive            Home Living Family/patient expects to be discharged to:: Private residence Living Arrangements: Alone Available Help at Discharge: Family;Available PRN/intermittently Type of Home: House Home Access: Stairs to enter Entrance Stairs-Number of Steps: 14 in the back, 9 from the garage   Home Layout: Multi-level Alternate Level Stairs-Number of Steps: flight, split level. main living upstairs.   Bathroom Shower/Tub: Occupational psychologist: Handicapped height     Home Equipment: [place] - single  point;Shower seat - built in;Grab bars - toilet;Grab bars - tub/shower          Prior Functioning/Environment Prior Level of Function : Independent/Modified Independent;Driving             Mobility Comments: no AD ADLs Comments: indep        OT Problem List: Decreased strength;Decreased range of motion;Decreased activity tolerance;Impaired balance (sitting and/or standing);Decreased safety awareness;Decreased knowledge of use of DME or AE;Decreased knowledge of precautions;Pain      OT Treatment/Interventions:      OT Goals(Current goals can be found in the care plan section) Acute Rehab OT Goals Patient Stated Goal: home soon OT Goal Formulation: All assessment and education complete, DC therapy Time For Goal Achievement: 12/17/21   AM-PAC OT "6 Clicks" Daily Activity     Outcome Measure Help from another person eating meals?: A Little Help from another person taking care of personal grooming?: A Little Help from another person toileting, which includes using toliet, bedpan, or urinal?: A Little Help from another person bathing (including washing, rinsing, drying)?: A Little Help from another person to put on and taking off regular upper body clothing?: A Little Help from another person to put on and taking off regular lower body clothing?: A Little 6 Click Score: 18   End of Session Equipment Utilized During Treatment: Gait belt;Rolling walker (2 wheels) Nurse Communication: Mobility status  Activity Tolerance: Patient tolerated treatment well Patient left: in bed;with call bell/phone within reach;with family/visitor present  OT Visit Diagnosis: Unsteadiness on feet (R26.81);Other abnormalities of gait and mobility (R26.89);Muscle weakness (generalized) (M62.81);Pain  Time: 9324-1991 OT Time Calculation (min): 27 min Charges:  OT General Charges $OT Visit: 1 Visit OT Evaluation $OT Eval Moderate Complexity: 1 Mod OT Treatments $Self Care/Home  Management : 8-22 mins  Chayce Robbins A Katyra Tomassetti 12/17/2021, 9:52 AM

## 2021-12-17 NOTE — TOC Transition Note (Signed)
Transition of Care [facility]) - CM/SW Discharge Note   Patient Details  Name: [name] MRN: [id number] Date of Birth: 11/09/1946  Transition of Care [facility]) CM/SW Contact:  Angelita Ingles, RN Phone Number:[phone]  12/17/2021, 12:21 PM   Clinical Narrative:    [facility] consulted for [facility]. CM at bedside and patient states that she is set up with [facility]. CM spoke with Amy at [facility] who verifies that patient was set up pre surgery. Latricia Heft will cover patient for [facility] needs.          Patient Goals and CMS Choice        Discharge Placement                       Discharge Plan and Services                                     Social Determinants of Health (SDOH) Interventions     Readmission Risk Interventions No flowsheet data found.

## 2021-12-17 NOTE — Plan of Care (Signed)
Pt and daughter given D/C instructions with verbal understanding. Rx was sent to the pharmacy by MD. Pt's incision is clean and dry with no sign of infection. Pt's IV was removed prior to D/C. Pt receieved RW per MD order. Pt D/C'd home via wheelchair per MD order. Pt is stable @ D/C and has no other needs at this time. Holli Humbles, RN

## 2021-12-17 NOTE — Evaluation (Signed)
Physical Therapy Evaluation Patient Details Name: Briana [name] MRN: [id number] DOB: [birth year] Today's Date: 12/17/2021  History of Present Illness  75 y.o. female presents to [facility] hospital on 12/16/2021 with unilateral tremor and bradykinesia. MRI demonstrates 3 levels of cervical stenosis with cord signal change. Pt underwent C4-7 ACDF on 12/16/2021. PMH includes depression, DMII, HTN, Parkinson disease.  Clinical Impression  Pt presents to PT with deficits in activity tolerance, power, gait, balance, endurance. Pt is able to mobilize without physical assistance during session, benefiting from UE support of walker to improve balance. Pt will benefit from frequent mobilization to improve activity tolerance and reduce falls risk. PT recommends discharge home with walker when medically ready.     Recommendations for follow up therapy are one component of a multi-disciplinary discharge planning process, led by the attending physician.  Recommendations may be updated based on patient status, additional functional criteria and insurance authorization.  Follow Up Recommendations Home health PT    Assistance Recommended at Discharge Intermittent Supervision/Assistance  Functional Status Assessment Patient has had a recent decline in their functional status and demonstrates the ability to make significant improvements in function in a reasonable and predictable amount of time.  Equipment Recommendations  Rolling walker (2 wheels)    Recommendations for Other Services       Precautions / Restrictions Precautions Precautions: Fall;Cervical Precaution Booklet Issued: Yes (comment) Precaution Comments: reviewed cervical precautions Required Braces or Orthoses:  (no brace needed - pt's daughter inquiring about a soft collar for comfort.) Restrictions Weight Bearing Restrictions: No      Mobility  Bed Mobility Overal bed mobility: Needs Assistance Bed Mobility: Rolling;Sidelying to Sit;Sit  to Sidelying Rolling: Supervision Sidelying to sit: Min guard     Sit to sidelying: Min guard General bed mobility comments: use of rails, pt requires assist to slide up in bed    Transfers Overall transfer level: Needs assistance Equipment used: Rolling walker (2 wheels) Transfers: Sit to/from Stand Sit to Stand: Min guard           General transfer comment: from bed & from toilet    Ambulation/Gait Ambulation/Gait assistance: Supervision Gait Distance (Feet): 150 Feet (additional 50' without device, PRN railing) Assistive device: Rolling walker (2 wheels) Gait Pattern/deviations: Step-through pattern Gait velocity: reduced Gait velocity interpretation: 1.31 - 2.62 ft/sec, indicative of limited community ambulator   General Gait Details: pt with slow but steady step-through gait, high guard and reaching for railing when without RW  Stairs Stairs: Yes Stairs assistance: Supervision Stair Management: Two rails;Step to pattern Number of Stairs: 10    Wheelchair Mobility    Modified Rankin (Stroke Patients Only)       Balance Overall balance assessment: Needs assistance Sitting-balance support: No upper extremity supported;Feet supported Sitting balance-Leahy Scale: Good     Standing balance support: No upper extremity supported;During functional activity Standing balance-Leahy Scale: Fair Standing balance comment: standing at sink to groom                             Pertinent Vitals/Pain Pain Assessment: 0-10 Pain Score: 3  Faces Pain Scale: Hurts even more Pain Location: posterior neck Pain Descriptors / Indicators: Aching Pain Intervention(s): Monitored during session    Home Living Family/patient expects to be discharged to:: Private residence Living Arrangements: Alone Available Help at Discharge: Family;Available PRN/intermittently (daughter available 24/7 initially) Type of Home: House Home Access: Stairs to enter Entrance  Stairs-Rails: Can reach both  Entrance Stairs-Number of Steps: 7 in from garage Alternate Level Stairs-Number of Steps: 7 Home Layout: Multi-level Home Equipment: Cane - single point;Shower seat - built in;Grab bars - toilet;Grab bars - tub/shower      Prior Function Prior Level of Function : Independent/Modified Independent;Driving             Mobility Comments: no AD ADLs Comments: indep     Hand Dominance        Extremity/Trunk Assessment   Upper Extremity Assessment Upper Extremity Assessment: Defer to OT evaluation RUE Deficits / Details: limited over head ROM due to an old injury LUE Deficits / Details: generally 3+/5, new numbness in digits LUE Sensation: decreased light touch LUE Coordination: decreased fine motor;decreased gross motor    Lower Extremity Assessment Lower Extremity Assessment: Generalized weakness    Cervical / Trunk Assessment Cervical / Trunk Assessment: Neck Surgery  Communication   Communication: No difficulties  Cognition Arousal/Alertness: Awake/alert Behavior During Therapy: WFL for tasks assessed/performed;Flat affect Overall Cognitive Status: Within Functional Limits for tasks assessed                                 General Comments: requires verbal cues to maintain cervical precautions during ADLs        General Comments General comments (skin integrity, edema, etc.): VSS on RA    Exercises     Assessment/Plan    PT Assessment Patient needs continued PT services  PT Problem List Decreased strength;Decreased activity tolerance;Decreased balance;Decreased mobility;Decreased knowledge of precautions;Pain       PT Treatment Interventions DME instruction;Gait training;Stair training;Functional mobility training;Therapeutic exercise;Therapeutic activities;Balance training;Neuromuscular re-education;Patient/family education    PT Goals (Current goals can be found in the Care Plan section)  Acute Rehab PT  Goals Patient Stated Goal: to go home PT Goal Formulation: With patient/family Time For Goal Achievement: 12/22/21 Potential to Achieve Goals: Good    Frequency Min 5X/week   Barriers to discharge        Co-evaluation               AM-PAC PT "6 Clicks" Mobility  Outcome Measure Help needed turning from your back to your side while in a flat bed without using bedrails?: A Little Help needed moving from lying on your back to sitting on the side of a flat bed without using bedrails?: A Little Help needed moving to and from a bed to a chair (including a wheelchair)?: A Little Help needed standing up from a chair using your arms (e.g., wheelchair or bedside chair)?: A Little Help needed to walk in hospital room?: A Little Help needed climbing 3-5 steps with a railing? : A Little 6 Click Score: 18    End of Session   Activity Tolerance: Patient tolerated treatment well Patient left: in bed;with call bell/phone within reach;with family/visitor present Nurse Communication: Mobility status PT Visit Diagnosis: Other abnormalities of gait and mobility (R26.89);Muscle weakness (generalized) (M62.81);Pain Pain - part of body:  (neck)    Time: 3893-7342 PT Time Calculation (min) (ACUTE ONLY): 24 min   Charges:   PT Evaluation $PT Eval Low Complexity: 1 Low          Zenaida Niece, PT, DPT Acute Rehabilitation Pager: [phone] Office [phone]   Zenaida Niece 12/17/2021, 11:35 AM

## 2021-12-17 NOTE — Progress Notes (Addendum)
Neurosurgery Service Progress Note  Subjective: No acute events overnight, normal post-op posterior cervical pain and odynophagia, mild dysphagia, L hand numbness feels subjectively mildly worse, otherwise no complaints   Objective: Vitals:   12/16/21 2031 12/16/21 2302 12/17/21 0323 12/17/21 0712  BP: (!) 101/57 (!) 100/58 98/60 121/77  Pulse: (!) 104 96 (!) 58 (!) 103  Resp: 18 18 18 18   Temp: 98.5 F (36.9 C) 98 F (36.7 C) 98 F (36.7 C) 99.1 F (37.3 C)  TempSrc: Oral Oral Oral Oral  SpO2: 91% (!) 88% 93% 95%  Weight:      Height:        Physical Exam: Incision c/d/I, voice normal character, Strength 5/5 x4, SILTx4 except L median mild numbness with +Tinel's at carpal tunnel, +mild R hoffman's, none on left  Assessment & Plan: 75 y.o. woman s/p 3 level ACDF for myelopathy, recovering well.  -discharge home today  Judith Part  12/17/21 7:52 AM

## 2021-12-21 HISTORY — PX: OTHER SURGICAL HISTORY: SHX169

## 2021-12-23 DIAGNOSIS — G20 Parkinson's disease: Secondary | ICD-10-CM | POA: Diagnosis not present

## 2021-12-23 DIAGNOSIS — F32A Depression, unspecified: Secondary | ICD-10-CM | POA: Diagnosis not present

## 2021-12-23 DIAGNOSIS — Z48811 Encounter for surgical aftercare following surgery on the nervous system: Secondary | ICD-10-CM | POA: Diagnosis not present

## 2021-12-23 DIAGNOSIS — K5900 Constipation, unspecified: Secondary | ICD-10-CM | POA: Diagnosis not present

## 2021-12-23 DIAGNOSIS — I10 Essential (primary) hypertension: Secondary | ICD-10-CM | POA: Diagnosis not present

## 2021-12-23 DIAGNOSIS — K219 Gastro-esophageal reflux disease without esophagitis: Secondary | ICD-10-CM | POA: Diagnosis not present

## 2021-12-23 DIAGNOSIS — E785 Hyperlipidemia, unspecified: Secondary | ICD-10-CM | POA: Diagnosis not present

## 2021-12-23 DIAGNOSIS — E119 Type 2 diabetes mellitus without complications: Secondary | ICD-10-CM | POA: Diagnosis not present

## 2021-12-24 DIAGNOSIS — K219 Gastro-esophageal reflux disease without esophagitis: Secondary | ICD-10-CM | POA: Diagnosis not present

## 2021-12-24 DIAGNOSIS — I10 Essential (primary) hypertension: Secondary | ICD-10-CM | POA: Diagnosis not present

## 2021-12-24 DIAGNOSIS — E119 Type 2 diabetes mellitus without complications: Secondary | ICD-10-CM | POA: Diagnosis not present

## 2021-12-24 DIAGNOSIS — E785 Hyperlipidemia, unspecified: Secondary | ICD-10-CM | POA: Diagnosis not present

## 2021-12-24 DIAGNOSIS — G20 Parkinson's disease: Secondary | ICD-10-CM | POA: Diagnosis not present

## 2021-12-24 DIAGNOSIS — Z48811 Encounter for surgical aftercare following surgery on the nervous system: Secondary | ICD-10-CM | POA: Diagnosis not present

## 2021-12-24 DIAGNOSIS — F32A Depression, unspecified: Secondary | ICD-10-CM | POA: Diagnosis not present

## 2021-12-24 DIAGNOSIS — K5900 Constipation, unspecified: Secondary | ICD-10-CM | POA: Diagnosis not present

## 2021-12-29 DIAGNOSIS — E119 Type 2 diabetes mellitus without complications: Secondary | ICD-10-CM | POA: Diagnosis not present

## 2021-12-29 DIAGNOSIS — K219 Gastro-esophageal reflux disease without esophagitis: Secondary | ICD-10-CM | POA: Diagnosis not present

## 2021-12-29 DIAGNOSIS — I10 Essential (primary) hypertension: Secondary | ICD-10-CM | POA: Diagnosis not present

## 2021-12-29 DIAGNOSIS — F32A Depression, unspecified: Secondary | ICD-10-CM | POA: Diagnosis not present

## 2021-12-29 DIAGNOSIS — K5900 Constipation, unspecified: Secondary | ICD-10-CM | POA: Diagnosis not present

## 2021-12-29 DIAGNOSIS — G20 Parkinson's disease: Secondary | ICD-10-CM | POA: Diagnosis not present

## 2021-12-29 DIAGNOSIS — Z48811 Encounter for surgical aftercare following surgery on the nervous system: Secondary | ICD-10-CM | POA: Diagnosis not present

## 2021-12-29 DIAGNOSIS — E785 Hyperlipidemia, unspecified: Secondary | ICD-10-CM | POA: Diagnosis not present

## 2021-12-31 DIAGNOSIS — K5900 Constipation, unspecified: Secondary | ICD-10-CM | POA: Diagnosis not present

## 2021-12-31 DIAGNOSIS — E119 Type 2 diabetes mellitus without complications: Secondary | ICD-10-CM | POA: Diagnosis not present

## 2021-12-31 DIAGNOSIS — F32A Depression, unspecified: Secondary | ICD-10-CM | POA: Diagnosis not present

## 2021-12-31 DIAGNOSIS — K219 Gastro-esophageal reflux disease without esophagitis: Secondary | ICD-10-CM | POA: Diagnosis not present

## 2021-12-31 DIAGNOSIS — I10 Essential (primary) hypertension: Secondary | ICD-10-CM | POA: Diagnosis not present

## 2021-12-31 DIAGNOSIS — E785 Hyperlipidemia, unspecified: Secondary | ICD-10-CM | POA: Diagnosis not present

## 2021-12-31 DIAGNOSIS — Z48811 Encounter for surgical aftercare following surgery on the nervous system: Secondary | ICD-10-CM | POA: Diagnosis not present

## 2021-12-31 DIAGNOSIS — G20 Parkinson's disease: Secondary | ICD-10-CM | POA: Diagnosis not present

## 2022-01-05 DIAGNOSIS — K5900 Constipation, unspecified: Secondary | ICD-10-CM | POA: Diagnosis not present

## 2022-01-05 DIAGNOSIS — Z48811 Encounter for surgical aftercare following surgery on the nervous system: Secondary | ICD-10-CM | POA: Diagnosis not present

## 2022-01-05 DIAGNOSIS — K219 Gastro-esophageal reflux disease without esophagitis: Secondary | ICD-10-CM | POA: Diagnosis not present

## 2022-01-05 DIAGNOSIS — I10 Essential (primary) hypertension: Secondary | ICD-10-CM | POA: Diagnosis not present

## 2022-01-05 DIAGNOSIS — G20 Parkinson's disease: Secondary | ICD-10-CM | POA: Diagnosis not present

## 2022-01-05 DIAGNOSIS — E119 Type 2 diabetes mellitus without complications: Secondary | ICD-10-CM | POA: Diagnosis not present

## 2022-01-05 DIAGNOSIS — E785 Hyperlipidemia, unspecified: Secondary | ICD-10-CM | POA: Diagnosis not present

## 2022-01-05 DIAGNOSIS — F32A Depression, unspecified: Secondary | ICD-10-CM | POA: Diagnosis not present

## 2022-01-06 ENCOUNTER — Ambulatory Visit: Payer: Medicare Other | Admitting: Physical Therapy

## 2022-01-07 DIAGNOSIS — E119 Type 2 diabetes mellitus without complications: Secondary | ICD-10-CM | POA: Diagnosis not present

## 2022-01-07 DIAGNOSIS — Z48811 Encounter for surgical aftercare following surgery on the nervous system: Secondary | ICD-10-CM | POA: Diagnosis not present

## 2022-01-07 DIAGNOSIS — G20 Parkinson's disease: Secondary | ICD-10-CM | POA: Diagnosis not present

## 2022-01-07 DIAGNOSIS — K5900 Constipation, unspecified: Secondary | ICD-10-CM | POA: Diagnosis not present

## 2022-01-07 DIAGNOSIS — K219 Gastro-esophageal reflux disease without esophagitis: Secondary | ICD-10-CM | POA: Diagnosis not present

## 2022-01-07 DIAGNOSIS — I10 Essential (primary) hypertension: Secondary | ICD-10-CM | POA: Diagnosis not present

## 2022-01-07 DIAGNOSIS — E785 Hyperlipidemia, unspecified: Secondary | ICD-10-CM | POA: Diagnosis not present

## 2022-01-07 DIAGNOSIS — F32A Depression, unspecified: Secondary | ICD-10-CM | POA: Diagnosis not present

## 2022-01-09 DIAGNOSIS — G20 Parkinson's disease: Secondary | ICD-10-CM | POA: Diagnosis not present

## 2022-01-09 DIAGNOSIS — F32A Depression, unspecified: Secondary | ICD-10-CM | POA: Diagnosis not present

## 2022-01-09 DIAGNOSIS — K219 Gastro-esophageal reflux disease without esophagitis: Secondary | ICD-10-CM | POA: Diagnosis not present

## 2022-01-09 DIAGNOSIS — K5900 Constipation, unspecified: Secondary | ICD-10-CM | POA: Diagnosis not present

## 2022-01-09 DIAGNOSIS — E119 Type 2 diabetes mellitus without complications: Secondary | ICD-10-CM | POA: Diagnosis not present

## 2022-01-09 DIAGNOSIS — Z48811 Encounter for surgical aftercare following surgery on the nervous system: Secondary | ICD-10-CM | POA: Diagnosis not present

## 2022-01-09 DIAGNOSIS — I10 Essential (primary) hypertension: Secondary | ICD-10-CM | POA: Diagnosis not present

## 2022-01-09 DIAGNOSIS — E785 Hyperlipidemia, unspecified: Secondary | ICD-10-CM | POA: Diagnosis not present

## 2022-01-12 DIAGNOSIS — G20 Parkinson's disease: Secondary | ICD-10-CM | POA: Diagnosis not present

## 2022-01-12 DIAGNOSIS — Z48811 Encounter for surgical aftercare following surgery on the nervous system: Secondary | ICD-10-CM | POA: Diagnosis not present

## 2022-01-12 DIAGNOSIS — E785 Hyperlipidemia, unspecified: Secondary | ICD-10-CM | POA: Diagnosis not present

## 2022-01-12 DIAGNOSIS — F32A Depression, unspecified: Secondary | ICD-10-CM | POA: Diagnosis not present

## 2022-01-12 DIAGNOSIS — E119 Type 2 diabetes mellitus without complications: Secondary | ICD-10-CM | POA: Diagnosis not present

## 2022-01-12 DIAGNOSIS — K219 Gastro-esophageal reflux disease without esophagitis: Secondary | ICD-10-CM | POA: Diagnosis not present

## 2022-01-12 DIAGNOSIS — K5900 Constipation, unspecified: Secondary | ICD-10-CM | POA: Diagnosis not present

## 2022-01-12 DIAGNOSIS — I10 Essential (primary) hypertension: Secondary | ICD-10-CM | POA: Diagnosis not present

## 2022-01-13 DIAGNOSIS — D2262 Melanocytic nevi of left upper limb, including shoulder: Secondary | ICD-10-CM | POA: Diagnosis not present

## 2022-01-13 DIAGNOSIS — D225 Melanocytic nevi of trunk: Secondary | ICD-10-CM | POA: Diagnosis not present

## 2022-01-13 DIAGNOSIS — L905 Scar conditions and fibrosis of skin: Secondary | ICD-10-CM | POA: Diagnosis not present

## 2022-01-13 DIAGNOSIS — L821 Other seborrheic keratosis: Secondary | ICD-10-CM | POA: Diagnosis not present

## 2022-01-13 DIAGNOSIS — L570 Actinic keratosis: Secondary | ICD-10-CM | POA: Diagnosis not present

## 2022-01-13 DIAGNOSIS — Z85828 Personal history of other malignant neoplasm of skin: Secondary | ICD-10-CM | POA: Diagnosis not present

## 2022-01-13 DIAGNOSIS — I8392 Asymptomatic varicose veins of left lower extremity: Secondary | ICD-10-CM | POA: Diagnosis not present

## 2022-01-13 DIAGNOSIS — D2239 Melanocytic nevi of other parts of face: Secondary | ICD-10-CM | POA: Diagnosis not present

## 2022-01-13 DIAGNOSIS — D1801 Hemangioma of skin and subcutaneous tissue: Secondary | ICD-10-CM | POA: Diagnosis not present

## 2022-01-16 DIAGNOSIS — I10 Essential (primary) hypertension: Secondary | ICD-10-CM | POA: Diagnosis not present

## 2022-01-16 DIAGNOSIS — K219 Gastro-esophageal reflux disease without esophagitis: Secondary | ICD-10-CM | POA: Diagnosis not present

## 2022-01-16 DIAGNOSIS — E119 Type 2 diabetes mellitus without complications: Secondary | ICD-10-CM | POA: Diagnosis not present

## 2022-01-16 DIAGNOSIS — Z48811 Encounter for surgical aftercare following surgery on the nervous system: Secondary | ICD-10-CM | POA: Diagnosis not present

## 2022-01-16 DIAGNOSIS — F32A Depression, unspecified: Secondary | ICD-10-CM | POA: Diagnosis not present

## 2022-01-16 DIAGNOSIS — G20 Parkinson's disease: Secondary | ICD-10-CM | POA: Diagnosis not present

## 2022-01-16 DIAGNOSIS — E785 Hyperlipidemia, unspecified: Secondary | ICD-10-CM | POA: Diagnosis not present

## 2022-01-16 DIAGNOSIS — K5900 Constipation, unspecified: Secondary | ICD-10-CM | POA: Diagnosis not present

## 2022-01-20 DIAGNOSIS — K219 Gastro-esophageal reflux disease without esophagitis: Secondary | ICD-10-CM | POA: Diagnosis not present

## 2022-01-20 DIAGNOSIS — G20 Parkinson's disease: Secondary | ICD-10-CM | POA: Diagnosis not present

## 2022-01-20 DIAGNOSIS — F32A Depression, unspecified: Secondary | ICD-10-CM | POA: Diagnosis not present

## 2022-01-20 DIAGNOSIS — E785 Hyperlipidemia, unspecified: Secondary | ICD-10-CM | POA: Diagnosis not present

## 2022-01-20 DIAGNOSIS — I10 Essential (primary) hypertension: Secondary | ICD-10-CM | POA: Diagnosis not present

## 2022-01-20 DIAGNOSIS — Z48811 Encounter for surgical aftercare following surgery on the nervous system: Secondary | ICD-10-CM | POA: Diagnosis not present

## 2022-01-20 DIAGNOSIS — E119 Type 2 diabetes mellitus without complications: Secondary | ICD-10-CM | POA: Diagnosis not present

## 2022-01-20 DIAGNOSIS — K5900 Constipation, unspecified: Secondary | ICD-10-CM | POA: Diagnosis not present

## 2022-01-23 NOTE — Progress Notes (Signed)
Assessment/Plan:   1.  Parkinsons Disease  -continue carbidopa/levodopa 25/100, 1 tablet 3 times per day.  -Patient understands that tremor is exacerbated by her lung medications, albuterol and Symbicort.   2.  Cervical myelopathy  -Status post multilevel fusion with Dr. Venetia Constable on December 16 2021  3.  Constipation  -discussed nature and pathophysiology and association with PD  -discussed importance of hydration.  Pt is to increase water intake  -pt is given a copy of the rancho recipe  -recommended daily colace  -recommended miralax prn  4.  Tachycardia  -likely related to her lung meds (she used albuterol this AM) but needs f/u with PCP    Subjective:   Briana [name] was seen today in follow up for Parkinsons disease, newly diagnosed last visit.  My previous records were reviewed prior to todays visit as well as outside records available to me.  she continues on her carbidopa/levodopa.  She has had no falls.  No hallucinations.  No near syncope.  She has been following with Dr. Venetia Constable and I have discussed the case with him.  She had 3 level cervical fusion for cervical myelopathy that was just done about a month ago.  He did mention that the patient looks much better treated for Parkinson's than she did prior to being untreated.  Does c/o constipation.    Current prescribed movement disorder medications: Carbidopa/levodopa 25/100, 1 tablet 3 times per day.   PREVIOUS MEDICATIONS: Sinemet  ALLERGIES:   Allergies  Allergen Reactions   Erythromycin Base Nausea And Vomiting   Nitrofurantoin Macrocrystal Other (See Comments)    Unknown reaction    CURRENT MEDICATIONS:  Outpatient Encounter Medications as of 01/27/2022  Medication Sig   albuterol (VENTOLIN HFA) 108 (90 Base) MCG/ACT inhaler Inhale 1 puff into the lungs every 6 (six) hours as needed for wheezing or shortness of breath. Patient reports albuterol inhaler use, 1 puff as needed. She does not know the  specific dose /previous written order details   atorvastatin (LIPITOR) 40 MG tablet Take 40 mg by mouth daily.   budesonide-formoterol (SYMBICORT) 80-4.5 MCG/ACT inhaler Inhale 2 puffs into the lungs 2 (two) times daily. Patient reports she does not know specific dose/orders; only able to state that she has been prescribed Symbicort and Albuterol inhalers in the past. Dr. Elsworth Soho aware; no new orders rcvd.   buPROPion (WELLBUTRIN XL) 150 MG 24 hr tablet Take 150 mg by mouth daily.   Calcium Carbonate-Vitamin D 600-5 MG-MCG TABS Take 1 tablet by mouth daily.   carbidopa-levodopa (SINEMET IR) 25-100 MG tablet TAKE 1 TABLET BY MOUTH THREE TIMES DAILY(9AM,1PM,5PM)   pantoprazole (PROTONIX) 20 MG tablet Take 20 mg by mouth daily.   sertraline (ZOLOFT) 100 MG tablet Take 100 mg by mouth daily.   valsartan-hydrochlorothiazide (DIOVAN-HCT) 160-12.5 MG tablet Take 1 tablet by mouth daily.   [DISCONTINUED] oxyCODONE (OXY IR/ROXICODONE) 5 MG immediate release tablet Take 1 tablet (5 mg total) by mouth every 4 (four) hours as needed for moderate pain ((score 4 to 6)). (Patient not taking: Reported on 01/27/2022)   No facility-administered encounter medications on file as of 01/27/2022.    Objective:   PHYSICAL EXAMINATION:    VITALS:   Vitals:   01/27/22 1253  BP: 107/64  Pulse: (!) 122  SpO2: 98%  Weight: 151 lb (68.5 kg)  Height: 5' (1.524 m)     GEN:  The patient appears stated age and is in NAD. HEENT:  Normocephalic, atraumatic.  The  mucous membranes are moist. The superficial temporal arteries are without ropiness or tenderness. CV:  tachy.  regular Lungs:  CTAB Neck/HEME:  There are no carotid bruits bilaterally.  Neurological examination:  Orientation: The patient is alert and oriented x3. Cranial nerves: There is good facial symmetry with facial hypomimia. The speech is fluent and clear. Soft palate rises symmetrically and there is no tongue deviation. Hearing is intact to conversational  tone. Sensation: Sensation is intact to light touch throughout Motor: Strength is at least antigravity x4.  Movement examination: Tone: There is normal tone today Abnormal movements: none today Coordination:  There is slowness with RAM's but no decremation Gait and Station: The patient has pushes off of the chair to arise.  She is forward flexed.  Gait is a bit antalgic.  No tremor with ambulation  I have reviewed and interpreted the following labs independently    Chemistry      Component Value Date/Time   NA 138 12/12/2021 1153   K 3.6 12/12/2021 1153   CL 102 12/12/2021 1153   CO2 28 12/12/2021 1153   BUN 18 12/12/2021 1153   CREATININE 0.99 12/12/2021 1153      Component Value Date/Time   CALCIUM 9.3 12/12/2021 1153   ALKPHOS 44 12/12/2021 1153   AST 43 (H) 12/12/2021 1153   ALT 6 12/12/2021 1153   BILITOT 0.9 12/12/2021 1153       Lab Results  Component Value Date   WBC 6.9 12/12/2021   HGB 11.9 (L) 12/12/2021   HCT 36.9 12/12/2021   MCV 87.4 12/12/2021   PLT 218 12/12/2021    No results found for: TSH    Cc:  Kelton Pillar, MD

## 2022-01-27 ENCOUNTER — Ambulatory Visit (INDEPENDENT_AMBULATORY_CARE_PROVIDER_SITE_OTHER): Payer: Medicare Other | Admitting: Neurology

## 2022-01-27 ENCOUNTER — Encounter: Payer: Self-pay | Admitting: Neurology

## 2022-01-27 ENCOUNTER — Other Ambulatory Visit: Payer: Self-pay

## 2022-01-27 VITALS — BP 107/64 | HR 113 | Ht 60.0 in | Wt 151.0 lb

## 2022-01-27 DIAGNOSIS — G20 Parkinson's disease: Secondary | ICD-10-CM

## 2022-01-27 DIAGNOSIS — G959 Disease of spinal cord, unspecified: Secondary | ICD-10-CM | POA: Diagnosis not present

## 2022-01-27 NOTE — Patient Instructions (Signed)
Constipation and Parkinson's disease:  1.Rancho recipe for constipation in Parkinsons Disease:  -1 cup of unprocessed bran (need to get this at [employer], [employer] or similar type of store), 2 cups of applesauce in 1 cup of prune juice 2.  Increase fiber intake (Metamucil,vegetables) 3.  Regular, moderate exercise can be beneficial. 4.  Avoid medications causing constipation, such as medications like antacids with calcium or magnesium 5.  It's okay to take daily Miralax, and taper if stools become too loose or you experience diarrhea 6.  Stool softeners (Colace) can help with chronic constipation and I recommend you take this daily. 7.  Increase water intake.  You should be drinking 1/2 gallon of water a day as long as you have not been diagnosed with congestive heart failure or renal/kidney failure.  This is probably the single greatest thing that you can do to help your constipation.  

## 2022-02-27 ENCOUNTER — Other Ambulatory Visit: Payer: Medicare Other

## 2022-03-02 ENCOUNTER — Other Ambulatory Visit: Payer: Self-pay | Admitting: Neurology

## 2022-03-02 DIAGNOSIS — G20 Parkinson's disease: Secondary | ICD-10-CM

## 2022-03-03 ENCOUNTER — Other Ambulatory Visit: Payer: Self-pay

## 2022-03-17 ENCOUNTER — Other Ambulatory Visit: Payer: Medicare Other

## 2022-03-17 ENCOUNTER — Ambulatory Visit
Admission: RE | Admit: 2022-03-17 | Discharge: 2022-03-17 | Disposition: A | Discharge status: Home / Self Care | Payer: Medicare Other | Source: Ambulatory Visit | Attending: Pulmonary Disease | Admitting: Pulmonary Disease

## 2022-03-17 DIAGNOSIS — S2241XA Multiple fractures of ribs, right side, initial encounter for closed fracture: Secondary | ICD-10-CM | POA: Diagnosis not present

## 2022-03-17 DIAGNOSIS — I2510 Atherosclerotic heart disease of native coronary artery without angina pectoris: Secondary | ICD-10-CM | POA: Diagnosis not present

## 2022-03-17 DIAGNOSIS — I700 Atherosclerosis of aorta: Secondary | ICD-10-CM | POA: Diagnosis not present

## 2022-03-17 DIAGNOSIS — J984 Other disorders of lung: Secondary | ICD-10-CM | POA: Diagnosis not present

## 2022-03-17 DIAGNOSIS — R911 Solitary pulmonary nodule: Secondary | ICD-10-CM

## 2022-04-10 DIAGNOSIS — E119 Type 2 diabetes mellitus without complications: Secondary | ICD-10-CM | POA: Diagnosis not present

## 2022-04-25 IMAGING — RF DG CERVICAL SPINE 1V
1 series · 1 of 1 positions shown · non-contrast
Comparison: None.

CLINICAL DATA: Anterior cervical fusion

EXAM:
DG CERVICAL SPINE - 1 VIEW

[Series 1: run · 1 of 1 slices shown]
[im 1/1]
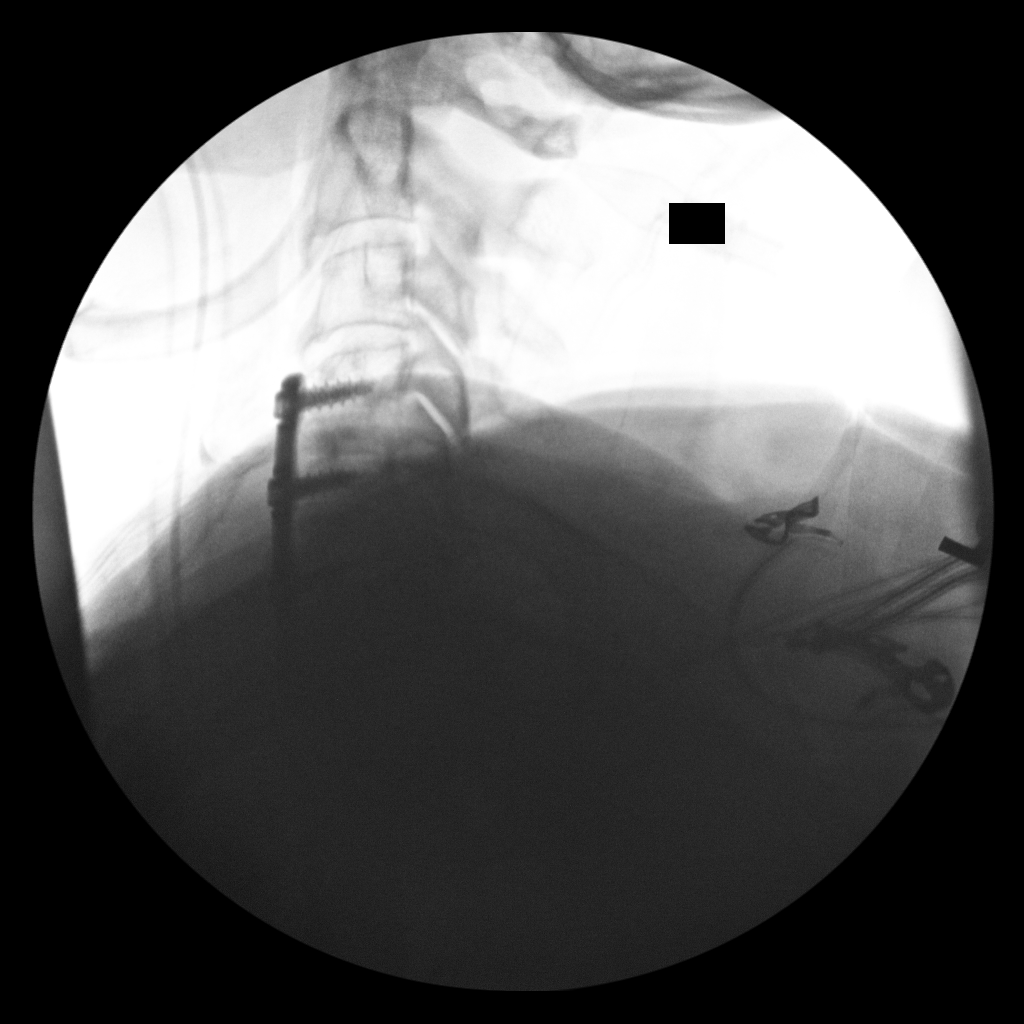

[1 of 1 positions shown; findings below may reference images not displayed]

FINDINGS: Multiple intraoperative fluoroscopic spot images are provided.
Anterior cervical fusion from C4 through C7.

FLUOROSCOPY TIME:  Fluoroscopy Time:  9 seconds

Radiation Exposure Index (if provided by the fluoroscopic device):
0.99 mGy

Number of Acquired Spot Images: 0
IMPRESSION: Anterior cervical fusion from C4 through C7.

## 2022-04-27 ENCOUNTER — Encounter: Payer: Self-pay | Admitting: Pulmonary Disease

## 2022-04-27 ENCOUNTER — Ambulatory Visit (INDEPENDENT_AMBULATORY_CARE_PROVIDER_SITE_OTHER): Payer: Medicare Other | Admitting: Pulmonary Disease

## 2022-04-27 DIAGNOSIS — J4530 Mild persistent asthma, uncomplicated: Secondary | ICD-10-CM | POA: Diagnosis not present

## 2022-04-27 DIAGNOSIS — M810 Age-related osteoporosis without current pathological fracture: Secondary | ICD-10-CM | POA: Diagnosis not present

## 2022-04-27 DIAGNOSIS — R0609 Other forms of dyspnea: Secondary | ICD-10-CM

## 2022-04-27 NOTE — Assessment & Plan Note (Signed)
Not convinced about true asthma here ?Advised her to discontinue Symbicort and use albuterol on a as needed basis ?

## 2022-04-27 NOTE — Progress Notes (Signed)
? ?  Subjective:  ? ? Patient ID: Briana [name], female    DOB: 1946/12/05, 76 y.o.   MRN: [id number] ? ?HPI ? ?76 yo remote smoker for follow-up of dyspnea ?--Peripheral basilar groundglass infiltrates on HR CT chest in 05/2021 concerning for early ILD especially NSIP. ?- repeat CT chest showed mosaic pattern more consistent with small vessel disease or bronchiolitis ?PFTs have shown mild restriction with normal DLCO ? ?  ?PMH - Parkinson's  ? cervical myelopathy  ? ?104-month follow-up visit ?She underwent neck surgery.  Parkinson's is controlled with medication.  She tries to stay active she goes to a cycling class and [facility] ?Denies cough or wheezing ?Does not feel like Symbicort has helped much ? ?Significant tests/ events reviewed ? ?PFTs 04/2021 moderate restriction with no airflow obstruction.  FEV1 was 69%, ratio 85, FVC 60%, DLCO 108%.,  No significant bronchodilator response. ?  ?Serology-ANA, CCP negative, ESR 45 ? ?HRCT 02/2022  no ILD, Chronic subpleural ground-glass in the lateral right lower lobe . Relatively thin walled cavitary lesion in the apical left upper ?lobe appears similar to 07/12/2018-benign ? ? ?CT chest wo con 08/2021 >> Diffuse areas of mosaic attenuation in the chest, left upper lobe nodule stable 9 x 5 mm, right upper lobe 5 mm pleural-based nodule stable ?  ?HRCT 05/2021 0.9 cm cavitary nodule in left upper lobe, bibasal peripheral groundglass opacities?  NSIP versus postinflammatory , chronic T6 and T8 compression fractures ?  ?03/2021 Sniff test neg  ?  ?Echo 09/2020 normal LV function, mild diastolic dysfunction ?  ?PFTs 12/2010 near nml ?CT T-spine 06/2018 >> compression deformities at T6, T11and T12 acute T8 fracure ?Degenerative disc disease of C-spine noted ? ?Review of Systems ?neg for any significant sore throat, dysphagia, itching, sneezing, nasal congestion or excess/ purulent secretions, fever, chills, sweats, unintended wt loss, pleuritic or exertional  cp, hempoptysis, orthopnea pnd or change in chronic leg swelling. Also denies presyncope, palpitations, heartburn, abdominal pain, nausea, vomiting, diarrhea or change in bowel or urinary habits, dysuria,hematuria, rash, arthralgias, visual complaints, headache, numbness weakness or ataxia. ? ?   ?Objective:  ? Physical Exam ? ? ?Gen. Pleasant, elderly,well-nourished, in no distress ?ENT - no thrush, no pallor/icterus,no post nasal drip ?Neck: No JVD, no thyromegaly, no carotid bruits ?Lungs: kyphosis ,no use of accessory muscles, no dullness to percussion, clear without rales or rhonchi  ?Cardiovascular: Rhythm regular, heart sounds  normal, no murmurs or gallops, no peripheral edema ?Musculoskeletal: No deformities, no cyanosis or clubbing  ? ? ? ? ?   ?Assessment & Plan:  ? ? ?

## 2022-04-27 NOTE — Assessment & Plan Note (Signed)
Serial CT imaging was reviewed, does not seem to be significant ILD.  PFTs have shown extraparenchymal restriction which is likely related to her kyphosis.  She also has vertebral fractures.  We will continue annual follow-up but I doubt significant ILD here.  I have encouraged her to improve her conditioning ?

## 2022-04-27 NOTE — Assessment & Plan Note (Signed)
She does have significant vertebral fracture signifying osteoporosis.  She may be a candidate for kyphoplasty if she is symptomatic ?

## 2022-04-27 NOTE — Patient Instructions (Signed)
CT does not show fibrosis ?

## 2022-05-13 DIAGNOSIS — G959 Disease of spinal cord, unspecified: Secondary | ICD-10-CM | POA: Diagnosis not present

## 2022-05-13 DIAGNOSIS — G5600 Carpal tunnel syndrome, unspecified upper limb: Secondary | ICD-10-CM | POA: Diagnosis not present

## 2022-06-23 ENCOUNTER — Other Ambulatory Visit: Payer: Self-pay | Admitting: Neurology

## 2022-06-23 DIAGNOSIS — G20 Parkinson's disease: Secondary | ICD-10-CM

## 2022-06-24 ENCOUNTER — Other Ambulatory Visit: Payer: Self-pay

## 2022-06-24 DIAGNOSIS — G20 Parkinson's disease: Secondary | ICD-10-CM

## 2022-06-24 MED ORDER — CARBIDOPA-LEVODOPA 25-100 MG PO TABS: 1.0000 | ORAL_TABLET | Freq: Three times a day (TID) | ORAL | 0 refills | Status: DC

## 2022-07-28 DIAGNOSIS — G629 Polyneuropathy, unspecified: Secondary | ICD-10-CM | POA: Diagnosis not present

## 2022-07-31 NOTE — Progress Notes (Signed)
Assessment/Plan:   1.  Parkinsons Disease  -continue carbidopa/levodopa 25/100, 1 tablet 3 times per day.  -Patient understands that tremor is exacerbated by her lung medications, albuterol and Symbicort.  -We discussed that it used to be thought that levodopa would increase risk of melanoma but now it is believed that Parkinsons itself likely increases risk of melanoma. she is to get regular skin checks.  She does that.   2.  Cervical myelopathy  -Status post multilevel fusion with Dr. Venetia Constable on December 16 2021.  Has a f/u next week.    3.  Depression  -wanted to refer her to psychiatry but she didn't want to do that  -on wellbutrin and sertraline now.  She is to make a follow upwith PCP who prescribes meds to see if change would be helpful.  I did not want to change medications that I do not prescribe.  -she met with my lcsw today   Subjective:   Briana [name] was seen today in follow up for Parkinsons disease.  My previous records were reviewed prior to todays visit as well as outside records available to me.  Following with pulmonary.  Records reviewed.  Pt denies falls.  Pt denies lightheadedness, near syncope.  No hallucinations.  Mood has been depressed.  She reports that she has been on depression meds for years but doesn't think that they are helping now.  She is also depressed re: weight gain.    Current prescribed movement disorder medications: Carbidopa/levodopa 25/100, 1 tablet 3 times per day.   PREVIOUS MEDICATIONS: Sinemet  ALLERGIES:   Allergies  Allergen Reactions   Erythromycin Base Nausea And Vomiting   Nitrofurantoin Macrocrystal Other (See Comments)    Unknown reaction    CURRENT MEDICATIONS:  Outpatient Encounter Medications as of 08/13/2022  Medication Sig   albuterol (VENTOLIN HFA) 108 (90 Base) MCG/ACT inhaler Inhale 1 puff into the lungs every 6 (six) hours as needed for wheezing or shortness of breath. Patient reports albuterol inhaler  use, 1 puff as needed. She does not know the specific dose /previous written order details   budesonide-formoterol (SYMBICORT) 80-4.5 MCG/ACT inhaler Inhale 2 puffs into the lungs 2 (two) times daily. Patient reports she does not know specific dose/orders; only able to state that she has been prescribed Symbicort and Albuterol inhalers in the past. Dr. Elsworth Soho aware; no new orders rcvd.   buPROPion (WELLBUTRIN XL) 150 MG 24 hr tablet Take 150 mg by mouth daily.   Calcium Carbonate-Vitamin D 600-5 MG-MCG TABS Take 1 tablet by mouth daily.   carbidopa-levodopa (SINEMET IR) 25-100 MG tablet TAKE 1 TABLET BY MOUTH THREE TIMES DAILY (9AM, 1PM, 5PM)   carbidopa-levodopa (SINEMET IR) 25-100 MG tablet Take 1 tablet by mouth 3 (three) times daily.   pantoprazole (PROTONIX) 20 MG tablet Take 20 mg by mouth daily.   sertraline (ZOLOFT) 100 MG tablet Take 100 mg by mouth daily.   valsartan-hydrochlorothiazide (DIOVAN-HCT) 160-12.5 MG tablet Take 1 tablet by mouth daily.   atorvastatin (LIPITOR) 40 MG tablet Take 40 mg by mouth daily. (Patient not taking: Reported on 08/13/2022)   No facility-administered encounter medications on file as of 08/13/2022.    Objective:   PHYSICAL EXAMINATION:    VITALS:   Vitals:   08/13/22 1420  BP: (!) 152/76  Pulse: 67  SpO2: 97%  Weight: 157 lb (71.2 kg)  Height: _0  (1.651 m)   Wt Readings from Last 3 Encounters:  08/13/22 157 lb (71.2 kg)  04/27/22 151 lb 6.4 oz (68.7 kg)  01/27/22 151 lb (68.5 kg)      GEN:  The patient appears stated age and is in NAD.  Tearful at times. HEENT:  Normocephalic, atraumatic.  The mucous membranes are moist. The superficial temporal arteries are without ropiness or tenderness. CV: Regular rate and rhythm Lungs:  CTAB Neck/HEME:  There are no carotid bruits bilaterally.  Neurological examination:  Orientation: The patient is alert and oriented x3. Cranial nerves: There is good facial symmetry with facial hypomimia. The  speech is fluent and clear. Soft palate rises symmetrically and there is no tongue deviation. Hearing is intact to conversational tone. Sensation: Sensation is intact to light touch throughout Motor: Strength is at least antigravity x4.  Movement examination: Tone: There is normal tone today Abnormal movements: none today Coordination:  There is slowness with RAM's but no decremation (same as previous) Gait and Station: The patient has pushes off of the chair to arise.  She is forward flexed.  Gait is antalgic (same as previous; relates to back pain).  No tremor with ambulation  I have reviewed and interpreted the following labs independently    Chemistry      Component Value Date/Time   NA 138 12/12/2021 1153   K 3.6 12/12/2021 1153   CL 102 12/12/2021 1153   CO2 28 12/12/2021 1153   BUN 18 12/12/2021 1153   CREATININE 0.99 12/12/2021 1153      Component Value Date/Time   CALCIUM 9.3 12/12/2021 1153   ALKPHOS 44 12/12/2021 1153   AST 43 (H) 12/12/2021 1153   ALT 6 12/12/2021 1153   BILITOT 0.9 12/12/2021 1153       Lab Results  Component Value Date   WBC 6.9 12/12/2021   HGB 11.9 (L) 12/12/2021   HCT 36.9 12/12/2021   MCV 87.4 12/12/2021   PLT 218 12/12/2021    No results found for: "TSH"  Total time spent on today's visit was 21 minutes, including both face-to-face time and nonface-to-face time.  Time included that spent on review of records (prior notes available to me/labs/imaging if pertinent), discussing treatment and goals, answering patient's questions and coordinating care.   Cc:  Kelton Pillar, MD

## 2022-08-13 ENCOUNTER — Encounter: Payer: Self-pay | Admitting: Neurology

## 2022-08-13 ENCOUNTER — Ambulatory Visit (INDEPENDENT_AMBULATORY_CARE_PROVIDER_SITE_OTHER): Payer: Medicare Other | Admitting: Neurology

## 2022-08-13 VITALS — BP 152/76 | HR 67 | Ht 65.0 in | Wt 157.0 lb

## 2022-08-13 DIAGNOSIS — F330 Major depressive disorder, recurrent, mild: Secondary | ICD-10-CM

## 2022-08-13 DIAGNOSIS — G20 Parkinson's disease: Secondary | ICD-10-CM | POA: Diagnosis not present

## 2022-08-13 NOTE — Patient Instructions (Signed)
Local and Online Resources for Power over Parkinson's Group August 2023  LOCAL [place] PARKINSON'S GROUPS  Power over Parkinson's Group:   Power Over Parkinson's Patient Education Group will be Wednesday, August 9th-*Hybrid meting*- in person at [facility] location and via [facility] at 2:00 pm.   Upcoming Power over Parkinson's Meetings:  2nd Wednesdays of the month at 2 pm:  August 9th, September 13th, October 11th Contact Amy Marriott at [email or web address] if interested in participating in this group Parkinson's Care Partners Group:    3rd Mondays, Contact Misty Paladino Atypical Parkinsonian Patient Group:   4th Wednesdays, Contact Misty Paladino If you are interested in participating in these groups with Misty, please contact her directly for how to join those meetings.  Her contact information is [email or web address].    LOCAL EVENTS AND NEW OFFERINGS Parkinson's T-shirts for sale!  Designed by a local group member, with funds going to [employer]. X-Large sizes available.  Contact Misty to purchase  New PWR! Moves [employer] Instructor-Led Class offering at [employer]!  Wednesdays 1-2 pm, starting April 12th.   Contact Ronalee Belts Sabin at [employer], [email or web address]   [place]:  [email or web address] PD Health at Home continues:  Mindfulness Mondays, Wellness Wednesdays, Fitness Fridays  Upcoming Education:   Care Partners:  Why they Matter and why their Needs Matter.  Wednesday, August 9th 1-2 pm Invisible Symptoms:  NonMotor Symptoms.  Wednesday, August 16th 1-2 pm Expert Briefing:   Parkinson's Disease and the Bladder.  Wednesday, September 13th 1:00-2:00 pm Register for expert briefings (webinars) at September 26 Please check out their website to sign up for emails and see their full online  offerings   [place]:  [email or web address]  Third Thursday Webinars:  On the third Thursday of every month at 12 p.m. ET, join our free live webinars to learn about various aspects of living with Parkinson's disease and our work to speed medical breakthroughs. Upcoming Webinar: Too Much or Not Enough:  Dyskinesia and "off" time in Parkinson's (Replay).  Thursday, August 17th at 12 noon. Check out additional information on their website to see their full online offerings  [facility]:  [email or web address] Upcoming Webinar:   Stay tuned Webinar Series:  Living with Parkinson's Meetup.   Third Thursdays each month, 3 pm Care Partner Monthly Meetup.  With 02-04-1987 [place].  First Tuesday of each month, 2 pm Check out additional information to Live Well Today on their website  Parkinson and Movement Disorders (PMD) Alliance:  www.[email or web address] [employer] Online:  Online Education Events Sign up for emails, which are sent weekly to give you updates on programming and online offerings  Parkinson's Association of the [place]:  www.[email or web address] Information on online support groups, education events, and online exercises including Yoga, Parkinson's exercises and more-LOTS of information on links to PD resources and online events Virtual Support Group through Parkinson's Association of the [place]; next one is scheduled for Wednesday, September 6th at 2 pm. (These are typically scheduled for the 1st Wednesday of the month at 2 pm).  Visit website for details. Save the date for "Caring for Parkinson's-Caring for You", 9th Annual Symposium.  In-person event in [place].  September 9th.  More info on registration to come. MOVEMENT AND EXERCISE OPPORTUNITIES PWR! Moves Classes at [place].  Wednesdays 10 and 11 am.   Contact Amy Marriott, PT [email or web address] if interested. NEW PWR! Moves Class offering at 09-20-1994.  Wednesdays 1-2 pm, starting April 12th.  Contact Caron Presume at [place], [email or web address] Here is a link to the PWR!Moves classes on Zoom from [place] - Daily Mon-Sat at 10:00. Via Zoom, FREE and open to all.  There is also a link below via Facebook if you use that platform.  [email or web address].si [email or web address]  Parkinson's Wellness Recovery (PWR! Moves)  www.[email or web address] Info on the PWR! Virtual Experience:  You will have access to our expertise through self-assessment, guided plans that start with the PD-specific fundamentals, educational content, tips, Q&A with an expert, and a growing Art therapist of PD-specific pre-recorded and live exercise classes of varying types and intensity - both physical and cognitive! If that is not enough, we offer 1:1 wellness consultations (in-person or virtual) to personalize your PWR! Research scientist (medical).  [place] Fridays:  As part of the PD Health @ Home program, this free video series focuses each week on one aspect of fitness designed to support people living with Parkinson's.  These weekly videos highlight the [place] recent fitness guidelines for people with Parkinson's disease. ModemGamers.si Dance for PD website is offering free, live-stream classes throughout the week, as well as links to [employer] of classes:  [email or web address]/ Virtual dance and Pilates for Parkinson's classes: Click on the Community Tab> Parkinson's Movement Initiative Tab.  To register for classes and for more information, visit [email or web address] and click the "community" tab.  [employer] Parkinson's Cycling Classes  [employer]:  Thursdays @ Noon-Live  classes at [employer] ([employer] at [email or web address] or [phone]) [employer]: Virtual Classes Mondays and Thursdays Jeanette Caprice classes Tuesday, Wednesday and Thursday (contact [place] at [email or web address]  or [phone]) [place] Varied levels of classes are offered Tuesdays and Thursdays at [facility].  Stretching with Verdis Frederickson weekly class is also offered for people with Parkinson's To observe a class or for more information, call [phone] or email Hezzie Bump at [email or web address] ADDITIONAL SUPPORT AND RESOURCES [employer]:Online Caregiver Education Opportunities:  www.well-[email or web address]/caregiver-education/caregiver-support-group.  You may also contact Vickki Muff at [email or web address] or [phone].    Well-Spring Navigator:  [phone] program, a free service to help individuals and families through the journey of determining care for older adults.  The "Navigator" is a [employer], Education officer, museum, who will speak with a prospective client and/or loved ones to provide an assessment of the situation and a set of recommendations for a personalized care plan -- all free of charge, and whether [facility] offers the needed service or not. If the need is not a service we provide, we are well-connected with reputable programs in town that we can refer you to.  www.well-springsolutions.org or to speak with the Navigator, call [phone].

## 2022-08-19 DIAGNOSIS — G5600 Carpal tunnel syndrome, unspecified upper limb: Secondary | ICD-10-CM | POA: Diagnosis not present

## 2022-08-28 DIAGNOSIS — M542 Cervicalgia: Secondary | ICD-10-CM | POA: Diagnosis not present

## 2022-08-28 DIAGNOSIS — M25511 Pain in right shoulder: Secondary | ICD-10-CM | POA: Diagnosis not present

## 2022-08-31 DIAGNOSIS — M25511 Pain in right shoulder: Secondary | ICD-10-CM | POA: Diagnosis not present

## 2022-09-08 DIAGNOSIS — M25511 Pain in right shoulder: Secondary | ICD-10-CM | POA: Diagnosis not present

## 2022-09-23 DIAGNOSIS — R1013 Epigastric pain: Secondary | ICD-10-CM | POA: Diagnosis not present

## 2022-10-02 DIAGNOSIS — M4854XD Collapsed vertebra, not elsewhere classified, thoracic region, subsequent encounter for fracture with routine healing: Secondary | ICD-10-CM | POA: Diagnosis not present

## 2022-10-02 DIAGNOSIS — D649 Anemia, unspecified: Secondary | ICD-10-CM | POA: Diagnosis not present

## 2022-10-02 DIAGNOSIS — K2970 Gastritis, unspecified, without bleeding: Secondary | ICD-10-CM | POA: Diagnosis not present

## 2022-10-02 DIAGNOSIS — K219 Gastro-esophageal reflux disease without esophagitis: Secondary | ICD-10-CM | POA: Diagnosis not present

## 2022-10-02 DIAGNOSIS — M25511 Pain in right shoulder: Secondary | ICD-10-CM | POA: Diagnosis not present

## 2022-10-02 DIAGNOSIS — Z23 Encounter for immunization: Secondary | ICD-10-CM | POA: Diagnosis not present

## 2022-10-02 DIAGNOSIS — N179 Acute kidney failure, unspecified: Secondary | ICD-10-CM | POA: Diagnosis not present

## 2022-10-08 DIAGNOSIS — M75121 Complete rotator cuff tear or rupture of right shoulder, not specified as traumatic: Secondary | ICD-10-CM | POA: Diagnosis not present

## 2022-10-12 DIAGNOSIS — E559 Vitamin D deficiency, unspecified: Secondary | ICD-10-CM | POA: Diagnosis not present

## 2022-10-12 DIAGNOSIS — D649 Anemia, unspecified: Secondary | ICD-10-CM | POA: Diagnosis not present

## 2022-10-12 DIAGNOSIS — Z23 Encounter for immunization: Secondary | ICD-10-CM | POA: Diagnosis not present

## 2022-10-12 DIAGNOSIS — E7800 Pure hypercholesterolemia, unspecified: Secondary | ICD-10-CM | POA: Diagnosis not present

## 2022-10-12 DIAGNOSIS — I129 Hypertensive chronic kidney disease with stage 1 through stage 4 chronic kidney disease, or unspecified chronic kidney disease: Secondary | ICD-10-CM | POA: Diagnosis not present

## 2022-10-12 DIAGNOSIS — I5032 Chronic diastolic (congestive) heart failure: Secondary | ICD-10-CM | POA: Diagnosis not present

## 2022-10-12 DIAGNOSIS — Z0000 Encounter for general adult medical examination without abnormal findings: Secondary | ICD-10-CM | POA: Diagnosis not present

## 2022-10-12 DIAGNOSIS — G20A2 Parkinson's disease without dyskinesia, with fluctuations: Secondary | ICD-10-CM | POA: Diagnosis not present

## 2022-10-12 DIAGNOSIS — E1121 Type 2 diabetes mellitus with diabetic nephropathy: Secondary | ICD-10-CM | POA: Diagnosis not present

## 2022-10-21 ENCOUNTER — Other Ambulatory Visit: Payer: Self-pay | Admitting: Physical Medicine and Rehabilitation

## 2022-10-21 ENCOUNTER — Ambulatory Visit
Admission: RE | Admit: 2022-10-21 | Discharge: 2022-10-21 | Disposition: A | Discharge status: Home / Self Care | Payer: Medicare Other | Source: Ambulatory Visit | Attending: Physical Medicine and Rehabilitation | Admitting: Physical Medicine and Rehabilitation

## 2022-10-21 DIAGNOSIS — S22080A Wedge compression fracture of T11-T12 vertebra, initial encounter for closed fracture: Secondary | ICD-10-CM | POA: Diagnosis not present

## 2022-10-21 DIAGNOSIS — M47816 Spondylosis without myelopathy or radiculopathy, lumbar region: Secondary | ICD-10-CM | POA: Diagnosis not present

## 2022-10-21 DIAGNOSIS — M4316 Spondylolisthesis, lumbar region: Secondary | ICD-10-CM | POA: Diagnosis not present

## 2022-10-21 DIAGNOSIS — M545 Low back pain, unspecified: Secondary | ICD-10-CM

## 2022-10-21 DIAGNOSIS — M419 Scoliosis, unspecified: Secondary | ICD-10-CM | POA: Diagnosis not present

## 2022-11-03 ENCOUNTER — Other Ambulatory Visit: Payer: Self-pay | Admitting: Orthopaedic Surgery

## 2022-11-03 DIAGNOSIS — M25511 Pain in right shoulder: Secondary | ICD-10-CM

## 2022-11-10 DIAGNOSIS — M47816 Spondylosis without myelopathy or radiculopathy, lumbar region: Secondary | ICD-10-CM | POA: Diagnosis not present

## 2022-11-11 ENCOUNTER — Other Ambulatory Visit: Payer: Medicare Other

## 2022-11-30 NOTE — Progress Notes (Unsigned)
Assessment/Plan:   1.  Parkinsons Disease  -continue carbidopa/levodopa 25/100, 1 tablet 3 times per day.  -Patient understands that tremor is exacerbated by her lung medications, albuterol and Symbicort.  -We discussed that it used to be thought that levodopa would increase risk of melanoma but now it is believed that Parkinsons itself likely increases risk of melanoma. she is to get regular skin checks.  She does that.   2.  Cervical myelopathy  -Status post multilevel fusion with Dr. Venetia Constable on December 16 2021.  Has a f/u next week.    3.  Depression  -wanted to refer her to psychiatry but she didn't want to do that  -on wellbutrin and sertraline now.  She is to make a follow upwith PCP who prescribes meds to see if change would be helpful.  I did not want to change medications that I do not prescribe.   Subjective:   Briana [name] was seen today in follow up for Parkinsons disease.  My previous records were reviewed prior to todays visit as well as outside records available to me.  Following with pulmonary.  Records reviewed.  Last visit, she was depressed, but her primary care was the one managing depression medications and she is on 2 of them.  I asked her to follow-up with her primary care to see if a change in medication could be useful.  She reports that ***.  She has been following with Dr. Zada Finders regarding carpal tunnel.  Current prescribed movement disorder medications: Carbidopa/levodopa 25/100, 1 tablet 3 times per day.   PREVIOUS MEDICATIONS: Sinemet  ALLERGIES:   Allergies  Allergen Reactions   Erythromycin Base Nausea And Vomiting   Nitrofurantoin Macrocrystal Other (See Comments)    Unknown reaction    CURRENT MEDICATIONS:  Outpatient Encounter Medications as of 12/02/2022  Medication Sig   albuterol (VENTOLIN HFA) 108 (90 Base) MCG/ACT inhaler Inhale 1 puff into the lungs every 6 (six) hours as needed for wheezing or shortness of breath. Patient  reports albuterol inhaler use, 1 puff as needed. She does not know the specific dose /previous written order details   atorvastatin (LIPITOR) 40 MG tablet Take 40 mg by mouth daily. (Patient not taking: Reported on 08/13/2022)   budesonide-formoterol (SYMBICORT) 80-4.5 MCG/ACT inhaler Inhale 2 puffs into the lungs 2 (two) times daily. Patient reports she does not know specific dose/orders; only able to state that she has been prescribed Symbicort and Albuterol inhalers in the past. Dr. Elsworth Soho aware; no new orders rcvd.   buPROPion (WELLBUTRIN XL) 150 MG 24 hr tablet Take 150 mg by mouth daily.   Calcium Carbonate-Vitamin D 600-5 MG-MCG TABS Take 1 tablet by mouth daily.   carbidopa-levodopa (SINEMET IR) 25-100 MG tablet TAKE 1 TABLET BY MOUTH THREE TIMES DAILY (9AM, 1PM, 5PM)   carbidopa-levodopa (SINEMET IR) 25-100 MG tablet Take 1 tablet by mouth 3 (three) times daily.   pantoprazole (PROTONIX) 20 MG tablet Take 20 mg by mouth daily.   sertraline (ZOLOFT) 100 MG tablet Take 100 mg by mouth daily.   valsartan-hydrochlorothiazide (DIOVAN-HCT) 160-12.5 MG tablet Take 1 tablet by mouth daily.   No facility-administered encounter medications on file as of 12/02/2022.    Objective:   PHYSICAL EXAMINATION:    VITALS:   There were no vitals filed for this visit.  Wt Readings from Last 3 Encounters:  08/13/22 157 lb (71.2 kg)  04/27/22 151 lb 6.4 oz (68.7 kg)  01/27/22 151 lb (68.5 kg)  GEN:  The patient appears stated age and is in NAD.  Tearful at times. HEENT:  Normocephalic, atraumatic.  The mucous membranes are moist. The superficial temporal arteries are without ropiness or tenderness. CV: Regular rate and rhythm Lungs:  CTAB Neck/HEME:  There are no carotid bruits bilaterally.  Neurological examination:  Orientation: The patient is alert and oriented x3. Cranial nerves: There is good facial symmetry with facial hypomimia. The speech is fluent and clear. Soft palate rises  symmetrically and there is no tongue deviation. Hearing is intact to conversational tone. Sensation: Sensation is intact to light touch throughout Motor: Strength is at least antigravity x4.  Movement examination: Tone: There is normal tone today Abnormal movements: none today Coordination:  There is slowness with RAM's but no decremation (same as previous) Gait and Station: The patient has pushes off of the chair to arise.  She is forward flexed.  Gait is antalgic (same as previous; relates to back pain).  No tremor with ambulation  I have reviewed and interpreted the following labs independently    Chemistry      Component Value Date/Time   NA 138 12/12/2021 1153   K 3.6 12/12/2021 1153   CL 102 12/12/2021 1153   CO2 28 12/12/2021 1153   BUN 18 12/12/2021 1153   CREATININE 0.99 12/12/2021 1153      Component Value Date/Time   CALCIUM 9.3 12/12/2021 1153   ALKPHOS 44 12/12/2021 1153   AST 43 (H) 12/12/2021 1153   ALT 6 12/12/2021 1153   BILITOT 0.9 12/12/2021 1153       Lab Results  Component Value Date   WBC 6.9 12/12/2021   HGB 11.9 (L) 12/12/2021   HCT 36.9 12/12/2021   MCV 87.4 12/12/2021   PLT 218 12/12/2021    No results found for: "TSH"  Total time spent on today's visit was *** minutes, including both face-to-face time and nonface-to-face time.  Time included that spent on review of records (prior notes available to me/labs/imaging if pertinent), discussing treatment and goals, answering patient's questions and coordinating care.   Cc:  Kelton Pillar, MD

## 2022-12-02 ENCOUNTER — Ambulatory Visit (INDEPENDENT_AMBULATORY_CARE_PROVIDER_SITE_OTHER): Payer: Medicare Other | Admitting: Neurology

## 2022-12-02 ENCOUNTER — Encounter: Payer: Self-pay | Admitting: Neurology

## 2022-12-02 VITALS — BP 118/72 | HR 72 | Ht 60.0 in | Wt 151.4 lb

## 2022-12-02 DIAGNOSIS — G8929 Other chronic pain: Secondary | ICD-10-CM | POA: Diagnosis not present

## 2022-12-02 DIAGNOSIS — M545 Low back pain, unspecified: Secondary | ICD-10-CM | POA: Diagnosis not present

## 2022-12-02 DIAGNOSIS — G20A1 Parkinson's disease without dyskinesia, without mention of fluctuations: Secondary | ICD-10-CM | POA: Diagnosis not present

## 2022-12-02 NOTE — Patient Instructions (Addendum)
Regarding upcoming surgery, I recommend:  -take your parkinsons medication the AM of surgery  -if medication for nausea is needed after surgery, I recommend zofran instead of phenergan as phenergan can make parkinsons worse  -asking your surgeon if it is possible if the surgery can be done under a local block or spinal anesthesia.  If not then general anesthesia is fine  Local and Online Resources for Power over Parkinson's Group  December 2023    LOCAL [place] PARKINSON'S GROUPS   Power over Parkinson's Group:    Power Over Parkinson's Patient Education Group will be Wednesday, December 13th-*Hybrid meting*- in person at [facility] location and via [facility], 2:00-3:00 pm.   Starting in November, Power over [employer] and Care Partner Groups will meet together, with plans for separate break out session for caregivers (*this will be evolving over the next few months) Upcoming Power over Parkinson's Meetings/Care Partner Support:  2nd Wednesdays of the month at 2 pm:   December 13th, January 10th  [place] at [email or web address] if interested in participating in this group    [place] Party!  Wednesday, December 6th, 4:00-5:00 pm.  [facility] and Fitness.  RSVP to Garnetta Buddy at [phone] or [email or web address] New PWR! Moves Dynegy Instructor-Led Classes offering at [employer]!  TUESDAYS and Wednesdays 1-2 pm.   Contact Vonna Kotyk at  [email or web address]  or [phone] (Tuesday classes are modified for chair and standing only) Dance for Parkinson 's classes will be on Tuesdays 9:30am-10:30am starting October 3-December 12 with a break the week of November 21st. Located in the [employer] , in the first floor of the [employer] ([place].) To register:  [email or web address] or [phone]  Drumming for Parkinson's will be held on 2nd and  4th Mondays at 11:00 am.   Located at the [place] ([place].)  [place] at [email or web address] or [phone]  Through support from the [place] for Parkinson's classes are free for both patients and caregivers.    [employer] Parkinson's Tai Chi Class, Mondays at 11 am.  Call [phone] for details   [place]:  [email or web address]  PD Health at Home continues:  Mindfulness Mondays, Wellness Wednesdays, Fitness Fridays   Upcoming Education:    Eating and Feeling Well through the [place]. Wednesday, Dec. 6th,  1-2 pm  Hospital Safety.  Wednesday, Dec. 13th, 1-2 pm Register for expert briefings (webinars) at [email or web address]  Please check out their website to sign up for emails and see their full online offerings      [place]:  [email or web address]   Third Thursday Webinars:  On the third Thursday of every month at 12 p.m. ET, join our free live webinars to learn about various aspects of living with Parkinson's disease and our work to speed medical breakthroughs.  Upcoming Webinar:  Tools for Diagnosing and Visualizing Parkinson's Disease.  Thursday, December 21st at 12 noon. Check out additional information on their website to see their full online offerings    [facility]:  [email or web address]  Upcoming Webinar:   Stay tuned  Webinar Series:  Living with Parkinson's Meetup.   Third Thursdays each month, 3 pm  Care Partner Monthly Meetup.  With Robin Searing Phinney.  First Tuesday of each month, 2 pm  Check out additional information to Live Well Today on their website  Parkinson and Movement Disorders (PMD) Alliance:  www.[email or web address]  [employer]:  Online Education Events  Sign up for emails, which are sent weekly  to give you updates on programming and online offerings    Parkinson's Association of the [place]:  www.[email or web address]  Information on online support groups, education events, and online exercises including Yoga, Parkinson's exercises and more-LOTS of information on links to PD resources and online events  Virtual Support Group through Parkinson's Association of the [place]; next one is scheduled for Wednesday, December 6th  at 2 pm.  (These are typically scheduled for the 1st Wednesday of the month at 2 pm).  Visit website for details.   MOVEMENT AND EXERCISE OPPORTUNITIES  PWR! Moves Classes at [place].  Wednesdays 10 and 11 am.   Contact Amy Marriott, PT [email or web address] if interested.  NEW PWR! Moves Class offerings at [employer].  *TUESDAYS* and Wednesdays 1-2 pm.    Contact Vonna Kotyk at  [email or web address]    Parkinson's Wellness Recovery (PWR! Moves)  [email or web address]  Info on the PWR! Virtual Experience:  You will have access to our expertise?through self-assessment, guided plans that start with the PD-specific fundamentals, educational content, tips, Q&A with an expert, and a growing Art therapist of PD-specific pre-recorded and live exercise classes of varying types and intensity - both physical and cognitive! If that is not enough, we offer 1:1 wellness consultations (in-person or virtual) to personalize your PWR! Research scientist (medical).   [place] Fridays:   As part of the PD Health @ Home program, this free video series focuses each week on one aspect of fitness designed to support people living with Parkinson's.? These weekly videos highlight the [place] fitness guidelines for people with Parkinson's disease.  [email or web address]   Dance for PD website is offering free, live-stream classes throughout the week, as well as links to [employer] of  classes:  [email or web address]  Virtual dance and Pilates for Parkinson's classes: Click on the Community Tab> Parkinson's Movement Initiative Tab.  To register for classes and for more information, visit [email or web address] and click the "community" tab.   [employer] Parkinson's Cycling Classes   [facility] [employer]:  Thursdays @ Noon-Live classes at [employer] ([employer] at [email or web address]?or [phone])  [facility]: Virtual Classes Mondays and Thursdays Jeanette Caprice classes Tuesday, Wednesday and Thursday (contact [place] at [email or web address] ?or [phone])  [place]  Varied levels of classes are offered Tuesdays and Thursdays at [employer].   Stretching with Verdis Frederickson weekly class is also offered for people with Parkinson's  To observe a class or for more information, call [phone] or email Hezzie Bump at [email or web address]   ADDITIONAL SUPPORT AND RESOURCES  [employer]:Online Caregiver Education Opportunities:  www.well-[email or web address]/caregiver-education/caregiver-support-group.  You may also contact Vickki Muff at [email or web address] or [phone].     Well[employer]Spring Navigator:  Just1Navigator program, a?free service to help individuals and families through the journey of determining care for older adults.  The "Navigator" is a Education officer, museum, Arnell Asal, who will speak with a prospective client and/or loved ones to provide an assessment of the situation and a set of recommendations for a personalized care plan -- all free of charge, and whether?[employer] offers the needed service or not. If the need is not a service we provide, we are well-connected with reputable programs in town that we can refer you to.  www.well-springsolutions.org or to speak with the Navigator, call [phone].

## 2022-12-08 ENCOUNTER — Ambulatory Visit
Admission: RE | Admit: 2022-12-08 | Discharge: 2022-12-08 | Disposition: A | Discharge status: Home / Self Care | Payer: Medicare Other | Source: Ambulatory Visit | Attending: Orthopaedic Surgery | Admitting: Orthopaedic Surgery

## 2022-12-08 DIAGNOSIS — M25511 Pain in right shoulder: Secondary | ICD-10-CM | POA: Diagnosis not present

## 2022-12-24 DIAGNOSIS — M25511 Pain in right shoulder: Secondary | ICD-10-CM | POA: Diagnosis not present

## 2022-12-24 DIAGNOSIS — Z01812 Encounter for preprocedural laboratory examination: Secondary | ICD-10-CM | POA: Diagnosis not present

## 2023-01-08 DIAGNOSIS — D649 Anemia, unspecified: Secondary | ICD-10-CM | POA: Diagnosis not present

## 2023-01-12 ENCOUNTER — Other Ambulatory Visit (HOSPITAL_COMMUNITY): Payer: Self-pay

## 2023-01-12 ENCOUNTER — Other Ambulatory Visit: Payer: Self-pay

## 2023-01-12 DIAGNOSIS — G20A1 Parkinson's disease without dyskinesia, without mention of fluctuations: Secondary | ICD-10-CM

## 2023-01-12 MED ORDER — CARBIDOPA-LEVODOPA 25-100 MG PO TABS
1.0000 | ORAL_TABLET | Freq: Three times a day (TID) | ORAL | 1 refills | Status: DC
  Filled 2023-01-12: qty 270, 90d supply, fill #0
  Filled 2023-06-09: qty 270, 90d supply, fill #1

## 2023-01-12 MED ORDER — VALSARTAN-HYDROCHLOROTHIAZIDE 160-12.5 MG PO TABS
1.0000 | ORAL_TABLET | Freq: Every day | ORAL | 1 refills | Status: DC
  Filled 2023-01-12: qty 90, 90d supply, fill #0

## 2023-01-12 MED ORDER — ATORVASTATIN CALCIUM 40 MG PO TABS
40.0000 mg | ORAL_TABLET | Freq: Every day | ORAL | 1 refills | Status: DC
  Filled 2023-01-12: qty 90, 90d supply, fill #0

## 2023-01-13 ENCOUNTER — Other Ambulatory Visit: Payer: Self-pay

## 2023-01-14 ENCOUNTER — Other Ambulatory Visit (HOSPITAL_COMMUNITY): Payer: Self-pay

## 2023-01-19 DIAGNOSIS — M25611 Stiffness of right shoulder, not elsewhere classified: Secondary | ICD-10-CM | POA: Diagnosis not present

## 2023-01-19 DIAGNOSIS — M19011 Primary osteoarthritis, right shoulder: Secondary | ICD-10-CM | POA: Diagnosis not present

## 2023-01-19 DIAGNOSIS — M6281 Muscle weakness (generalized): Secondary | ICD-10-CM | POA: Diagnosis not present

## 2023-01-20 DIAGNOSIS — M659 Synovitis and tenosynovitis, unspecified: Secondary | ICD-10-CM | POA: Diagnosis not present

## 2023-01-20 DIAGNOSIS — M19011 Primary osteoarthritis, right shoulder: Secondary | ICD-10-CM | POA: Diagnosis not present

## 2023-01-27 ENCOUNTER — Other Ambulatory Visit (HOSPITAL_COMMUNITY): Payer: Self-pay

## 2023-01-27 MED ORDER — SERTRALINE HCL 100 MG PO TABS
150.0000 mg | ORAL_TABLET | Freq: Every day | ORAL | 1 refills | Status: DC
  Filled 2023-01-27: qty 135, 90d supply, fill #0
  Filled 2023-04-24: qty 135, 90d supply, fill #1

## 2023-03-02 ENCOUNTER — Ambulatory Visit: Payer: Medicare Other | Admitting: Neurology

## 2023-03-03 ENCOUNTER — Other Ambulatory Visit (HOSPITAL_COMMUNITY): Payer: Self-pay

## 2023-03-03 MED ORDER — PANTOPRAZOLE SODIUM 40 MG PO TBEC
40.0000 mg | DELAYED_RELEASE_TABLET | Freq: Every day | ORAL | 0 refills | Status: DC
  Filled 2023-03-03: qty 30, 30d supply, fill #0

## 2023-03-03 MED ORDER — BUPROPION HCL ER (XL) 150 MG PO TB24
150.0000 mg | ORAL_TABLET | Freq: Every day | ORAL | 0 refills | Status: DC
  Filled 2023-03-03: qty 30, 30d supply, fill #0

## 2023-03-04 ENCOUNTER — Other Ambulatory Visit: Payer: Self-pay

## 2023-03-27 ENCOUNTER — Other Ambulatory Visit (HOSPITAL_COMMUNITY): Payer: Self-pay

## 2023-03-29 ENCOUNTER — Other Ambulatory Visit (HOSPITAL_COMMUNITY): Payer: Self-pay

## 2023-03-29 MED ORDER — PANTOPRAZOLE SODIUM 40 MG PO TBEC
40.0000 mg | DELAYED_RELEASE_TABLET | Freq: Every day | ORAL | 0 refills | Status: DC
  Filled 2023-03-29: qty 30, 30d supply, fill #0

## 2023-04-08 ENCOUNTER — Other Ambulatory Visit (HOSPITAL_COMMUNITY): Payer: Self-pay

## 2023-04-08 MED ORDER — BUPROPION HCL ER (XL) 150 MG PO TB24
150.0000 mg | ORAL_TABLET | Freq: Every morning | ORAL | 0 refills | Status: DC
  Filled 2023-04-08: qty 90, 90d supply, fill #0

## 2023-04-22 ENCOUNTER — Other Ambulatory Visit: Payer: Self-pay

## 2023-04-22 ENCOUNTER — Other Ambulatory Visit (HOSPITAL_COMMUNITY): Payer: Self-pay

## 2023-04-22 MED ORDER — ATORVASTATIN CALCIUM 80 MG PO TABS
80.0000 mg | ORAL_TABLET | Freq: Every day | ORAL | 1 refills | Status: DC
  Filled 2023-04-22: qty 90, 90d supply, fill #0
  Filled 2023-07-06 – 2023-07-17 (×2): qty 90, 90d supply, fill #1

## 2023-04-23 ENCOUNTER — Other Ambulatory Visit (HOSPITAL_COMMUNITY): Payer: Self-pay

## 2023-04-26 ENCOUNTER — Other Ambulatory Visit: Payer: Self-pay

## 2023-04-28 ENCOUNTER — Ambulatory Visit (INDEPENDENT_AMBULATORY_CARE_PROVIDER_SITE_OTHER): Payer: HMO | Admitting: Adult Health

## 2023-04-28 ENCOUNTER — Other Ambulatory Visit (HOSPITAL_COMMUNITY): Payer: Self-pay

## 2023-04-28 ENCOUNTER — Other Ambulatory Visit: Payer: Self-pay

## 2023-04-28 ENCOUNTER — Encounter: Payer: Self-pay | Admitting: Adult Health

## 2023-04-28 VITALS — BP 104/60 | HR 96 | Temp 98.0°F | Ht 59.0 in | Wt 154.6 lb

## 2023-04-28 DIAGNOSIS — R0609 Other forms of dyspnea: Secondary | ICD-10-CM | POA: Diagnosis not present

## 2023-04-28 DIAGNOSIS — J4520 Mild intermittent asthma, uncomplicated: Secondary | ICD-10-CM

## 2023-04-28 MED ORDER — ALBUTEROL SULFATE HFA 108 (90 BASE) MCG/ACT IN AERS
1.0000 | INHALATION_SPRAY | Freq: Four times a day (QID) | RESPIRATORY_TRACT | 2 refills | Status: DC | PRN
  Filled 2023-04-28: qty 6.7, 25d supply, fill #0

## 2023-04-28 NOTE — Assessment & Plan Note (Signed)
Appears improved with exercise class. Suspect component of physical deconditioning .  Previous CT chest without significant ILD  follow up with our office As needed

## 2023-04-28 NOTE — Progress Notes (Signed)
@Patient  ID: Briana [name], female    DOB: 1946/12/16, 77 y.o.   MRN: [id number]  Chief Complaint  Patient presents with   Follow-up    Referring provider: Maurice Small, MD  HPI: 77 year old female minimum smoking history followed for shortness of breath and possible mild intermittent asthma, abnormal CT chest  Medical history significant for Parkinson's disease and cervical myelopathy  TEST/EVENTS :  Peripheral basilar groundglass infiltrates on HR CT chest in 05/2021 concerning for early ILD especially NSIP. - 02/2022 repeat CT chest showed mosaic pattern more consistent with small vessel disease or bronchiolitis PFTs have shown mild restriction with normal DLCO    04/28/2023 Follow up: Dyspnea and Mild Asthma  Patient presents for a 1 year follow-up.  Patient is followed for shortness of breath and mild intermittent asthma.  Last visit Symbicort was stopped.  Patient had no flare of symptoms off Symbicort.  Does not use any albuterol.  Says she occasionally has some intermittent wheezing.  But overall feels that her breathing has gotten better.  She has been participating at the [facility] and a cycle class for Parkinson's patients.  Feels that this is really helped her quite a bit.  She denies any significant cough.  Recently had shoulder surgery and is slowly recovering.  Allergies  Allergen Reactions   Erythromycin Base Nausea And Vomiting   Nitrofurantoin Macrocrystal Other (See Comments)    Unknown reaction    Immunization History  Administered Date(s) Administered   Fluad Quad(high Dose 65+) 10/09/2021   Influenza, High Dose Seasonal PF 10/17/2015, 09/25/2016, 09/28/2017, 09/07/2018, 09/20/2020   PFIZER(Purple Top)SARS-COV-2 Vaccination 01/10/2020, 01/31/2020, 10/02/2020    Past Medical History:  Diagnosis Date   Cancer (HCC)    Basal Cell Carcinoma   Complication of anesthesia    Depression    Diabetes mellitus without complication (HCC)    Dyspnea    Pt sees  pulm-Dr. Vassie Loll   Fatty liver    High cholesterol    Hypertension    Parkinson disease    PONV (postoperative nausea and vomiting)    After a colonoscopy procedure x1    Tobacco History: Social History   Tobacco Use  Smoking Status Former  Smokeless Tobacco Never   Counseling given: Not Answered   Outpatient Medications Prior to Visit  Medication Sig Dispense Refill   atorvastatin (LIPITOR) 80 MG tablet Take 1 tablet (80 mg total) by mouth daily. 90 tablet 1   buPROPion (WELLBUTRIN XL) 150 MG 24 hr tablet Take 1 tablet (150 mg total) by mouth in the morning. 90 tablet 0   Calcium Carbonate-Vitamin D 600-5 MG-MCG TABS Take 1 tablet by mouth daily.     carbidopa-levodopa (SINEMET IR) 25-100 MG tablet Take 1 tablet by mouth 3 (three) times daily (9AM, 1PM, 5PM) 270 tablet 1   pantoprazole (PROTONIX) 40 MG tablet Take 1 tablet (40 mg total) by mouth daily. 30 tablet 0   sertraline (ZOLOFT) 100 MG tablet Take 1.5 tablets (150 mg total) by mouth daily. 135 tablet 1   valsartan-hydrochlorothiazide (DIOVAN-HCT) 160-12.5 MG tablet Take 1 tablet by mouth daily. 90 tablet 1   atorvastatin (LIPITOR) 40 MG tablet Take 40 mg by mouth daily.     atorvastatin (LIPITOR) 40 MG tablet Take 1 tablet (40 mg total) by mouth daily. (Patient not taking: Reported on 04/28/2023) 90 tablet 1   buPROPion (WELLBUTRIN XL) 150 MG 24 hr tablet Take 150 mg by mouth daily. (Patient not taking: Reported on 04/28/2023)  buPROPion (WELLBUTRIN XL) 150 MG 24 hr tablet Take 1 tablet (150 mg total) by mouth daily. (Patient not taking: Reported on 04/28/2023) 30 tablet 0   pantoprazole (PROTONIX) 20 MG tablet Take 20 mg by mouth daily. (Patient not taking: Reported on 04/28/2023)     sertraline (ZOLOFT) 100 MG tablet Take 100 mg by mouth daily.     valsartan-hydrochlorothiazide (DIOVAN-HCT) 160-12.5 MG tablet Take 1 tablet by mouth daily.     No facility-administered medications prior to visit.     Review of Systems:    Constitutional:   No  weight loss, night sweats,  Fevers, chills, +fatigue, or  lassitude.  HEENT:   No headaches,  Difficulty swallowing,  Tooth/dental problems, or  Sore throat,                No sneezing, itching, ear ache, nasal congestion, post nasal drip,   CV:  No chest pain,  Orthopnea, PND, swelling in lower extremities, anasarca, dizziness, palpitations, syncope.   GI  No heartburn, indigestion, abdominal pain, nausea, vomiting, diarrhea, change in bowel habits, loss of appetite, bloody stools.   Resp: No shortness of breath with exertion or at rest.  No excess mucus, no productive cough,  No non-productive cough,  No coughing up of blood.  No change in color of mucus.  No wheezing.  No chest wall deformity  Skin: no rash or lesions.  GU: no dysuria, change in color of urine, no urgency or frequency.  No flank pain, no hematuria   MS:  No joint pain or swelling.  No decreased range of motion.  No back pain.    Physical Exam  BP 104/60 (BP Location: Left Arm, Patient Position: Sitting, Cuff Size: Large)   Pulse 96   Temp 98 F (36.7 C) (Oral)   Ht 4\' 11"  (1.499 m)   Wt 154 lb 9.6 oz (70.1 kg)   SpO2 96%   BMI 31.23 kg/m   GEN: A/Ox3; pleasant , NAD, well nourished    HEENT:  [place]/AT,   NOSE-clear, THROAT-clear, no lesions, no postnasal drip or exudate noted.   NECK:  Supple w/ fair ROM; no JVD; normal carotid impulses w/o bruits; no thyromegaly or nodules palpated; no lymphadenopathy.    RESP  Clear  P & A; w/o, wheezes/ rales/ or rhonchi. no accessory muscle use, no dullness to percussion  CARD:  RRR, no m/r/g, no peripheral edema, pulses intact, no cyanosis or clubbing.  GI:   Soft & nt; nml bowel sounds; no organomegaly or masses detected.   Musco: Warm bil, no deformities or joint swelling noted.   Neuro: alert, no focal deficits noted.    Skin: Warm, no lesions or rashes    Lab Results:   BNP No results found for: "BNP"  ProBNP No results  found for: "PROBNP"  Imaging: No results found.       Latest Ref Rng & Units 05/07/2021   10:51 AM  PFT Results  FVC-Pre L 1.42   FVC-Predicted Pre % 60   FVC-Post L 1.38   FVC-Predicted Post % 59   Pre FEV1/FVC % % 85   Post FEV1/FCV % % 87   FEV1-Pre L 1.20   FEV1-Predicted Pre % 69   FEV1-Post L 1.20   DLCO uncorrected ml/min/mmHg 18.10   DLCO UNC% % 108   DLCO corrected ml/min/mmHg 18.10   DLCO COR %Predicted % 108   DLVA Predicted % 153   TLC L 3.51   TLC %  Predicted % 78   RV % Predicted % 88     No results found for: "NITRICOXIDE"      Assessment & Plan:   Asthma Very mild intermittent asthma-no flare off Symbicort  Control for triggers  Albuterol inhaler As needed    Plan  Patient Instructions  Albuterol inhaler As needed   Activity as tolerated.  Follow up with our office As needed      Dyspnea on exertion Appears improved with exercise class. Suspect component of physical deconditioning .  Previous CT chest without significant ILD  follow up with our office As needed        Rubye Oaks, NP 04/28/2023

## 2023-04-28 NOTE — Patient Instructions (Signed)
Albuterol inhaler As needed   Activity as tolerated.  Follow up with our office As needed

## 2023-04-28 NOTE — Assessment & Plan Note (Signed)
Very mild intermittent asthma-no flare off Symbicort  Control for triggers  Albuterol inhaler As needed    Plan  Patient Instructions  Albuterol inhaler As needed   Activity as tolerated.  Follow up with our office As needed

## 2023-05-07 ENCOUNTER — Other Ambulatory Visit: Payer: Self-pay

## 2023-05-07 ENCOUNTER — Other Ambulatory Visit (HOSPITAL_COMMUNITY): Payer: Self-pay

## 2023-05-07 MED ORDER — PANTOPRAZOLE SODIUM 40 MG PO TBEC
40.0000 mg | DELAYED_RELEASE_TABLET | Freq: Every day | ORAL | 3 refills | Status: DC
  Filled 2023-05-07: qty 90, 90d supply, fill #0
  Filled 2023-07-06 – 2023-09-09 (×2): qty 90, 90d supply, fill #1
  Filled 2023-10-19: qty 90, 90d supply, fill #2

## 2023-05-21 ENCOUNTER — Other Ambulatory Visit (HOSPITAL_COMMUNITY): Payer: Self-pay

## 2023-06-08 ENCOUNTER — Ambulatory Visit: Payer: Medicare Other | Admitting: Neurology

## 2023-06-09 ENCOUNTER — Other Ambulatory Visit (HOSPITAL_COMMUNITY): Payer: Self-pay

## 2023-07-06 ENCOUNTER — Other Ambulatory Visit (HOSPITAL_COMMUNITY): Payer: Self-pay

## 2023-07-06 ENCOUNTER — Other Ambulatory Visit: Payer: Self-pay

## 2023-07-06 ENCOUNTER — Other Ambulatory Visit: Payer: Self-pay | Admitting: Family Medicine

## 2023-07-06 MED ORDER — SERTRALINE HCL 100 MG PO TABS
150.0000 mg | ORAL_TABLET | Freq: Every day | ORAL | 3 refills | Status: DC
  Filled 2023-07-06: qty 135, 90d supply, fill #0
  Filled 2023-10-19: qty 135, 90d supply, fill #1
  Filled 2023-12-07 – 2024-02-08 (×3): qty 135, 90d supply, fill #2
  Filled 2024-05-04: qty 135, 90d supply, fill #3

## 2023-07-06 MED ORDER — BUPROPION HCL ER (XL) 150 MG PO TB24
150.0000 mg | ORAL_TABLET | Freq: Every morning | ORAL | 0 refills | Status: DC
  Filled 2023-07-06: qty 90, 90d supply, fill #0

## 2023-07-07 ENCOUNTER — Other Ambulatory Visit (HOSPITAL_COMMUNITY): Payer: Self-pay

## 2023-07-17 ENCOUNTER — Other Ambulatory Visit (HOSPITAL_COMMUNITY): Payer: Self-pay

## 2023-09-09 ENCOUNTER — Other Ambulatory Visit: Payer: Self-pay | Admitting: Family Medicine

## 2023-09-09 ENCOUNTER — Other Ambulatory Visit: Payer: Self-pay | Admitting: Neurology

## 2023-09-09 DIAGNOSIS — G20A1 Parkinson's disease without dyskinesia, without mention of fluctuations: Secondary | ICD-10-CM

## 2023-09-10 ENCOUNTER — Other Ambulatory Visit: Payer: Self-pay

## 2023-09-10 ENCOUNTER — Other Ambulatory Visit (HOSPITAL_COMMUNITY): Payer: Self-pay

## 2023-09-10 MED ORDER — CARBIDOPA-LEVODOPA 25-100 MG PO TABS
1.0000 | ORAL_TABLET | Freq: Three times a day (TID) | ORAL | 0 refills | Status: DC
Start: 2023-09-10 — End: 2023-12-07
  Filled 2023-09-10: qty 180, 60d supply, fill #0

## 2023-09-13 ENCOUNTER — Other Ambulatory Visit: Payer: Self-pay | Admitting: Family Medicine

## 2023-09-13 ENCOUNTER — Other Ambulatory Visit: Payer: Self-pay

## 2023-09-13 ENCOUNTER — Other Ambulatory Visit: Payer: Self-pay | Admitting: Orthopaedic Surgery

## 2023-09-13 DIAGNOSIS — S42123A Displaced fracture of acromial process, unspecified shoulder, initial encounter for closed fracture: Secondary | ICD-10-CM

## 2023-09-15 ENCOUNTER — Other Ambulatory Visit: Payer: Self-pay | Admitting: Family Medicine

## 2023-09-20 ENCOUNTER — Other Ambulatory Visit (HOSPITAL_COMMUNITY): Payer: Self-pay

## 2023-09-20 MED ORDER — VALSARTAN-HYDROCHLOROTHIAZIDE 160-12.5 MG PO TABS
1.0000 | ORAL_TABLET | Freq: Every day | ORAL | 1 refills | Status: DC
  Filled 2023-09-20: qty 90, 90d supply, fill #0
  Filled 2023-10-19 – 2023-12-07 (×2): qty 90, 90d supply, fill #1

## 2023-09-24 ENCOUNTER — Ambulatory Visit
Admission: RE | Admit: 2023-09-24 | Discharge: 2023-09-24 | Disposition: A | Discharge status: Home / Self Care | Payer: HMO | Source: Ambulatory Visit | Attending: Orthopaedic Surgery | Admitting: Orthopaedic Surgery

## 2023-09-24 DIAGNOSIS — S42123A Displaced fracture of acromial process, unspecified shoulder, initial encounter for closed fracture: Secondary | ICD-10-CM

## 2023-10-19 ENCOUNTER — Other Ambulatory Visit (HOSPITAL_COMMUNITY): Payer: Self-pay

## 2023-10-19 ENCOUNTER — Other Ambulatory Visit: Payer: Self-pay

## 2023-10-19 MED ORDER — ATORVASTATIN CALCIUM 80 MG PO TABS
80.0000 mg | ORAL_TABLET | Freq: Every day | ORAL | 1 refills | Status: DC
  Filled 2023-10-19: qty 90, 90d supply, fill #0
  Filled 2024-02-18: qty 90, 90d supply, fill #1

## 2023-10-19 MED ORDER — BUPROPION HCL ER (XL) 150 MG PO TB24
150.0000 mg | ORAL_TABLET | Freq: Every day | ORAL | 0 refills | Status: DC
  Filled 2023-10-19: qty 90, 90d supply, fill #0

## 2023-10-20 ENCOUNTER — Other Ambulatory Visit: Payer: Self-pay

## 2023-10-25 ENCOUNTER — Other Ambulatory Visit (HOSPITAL_COMMUNITY): Payer: Self-pay

## 2023-10-25 MED ORDER — PANTOPRAZOLE SODIUM 40 MG PO TBEC
40.0000 mg | DELAYED_RELEASE_TABLET | Freq: Two times a day (BID) | ORAL | 1 refills | Status: DC
  Filled 2023-10-25 – 2023-10-26 (×2): qty 60, 30d supply, fill #0
  Filled 2023-10-27: qty 30, 15d supply, fill #0
  Filled 2023-11-06: qty 90, 45d supply, fill #1

## 2023-10-27 ENCOUNTER — Other Ambulatory Visit: Payer: Self-pay

## 2023-10-27 ENCOUNTER — Other Ambulatory Visit (HOSPITAL_COMMUNITY): Payer: Self-pay

## 2023-10-28 ENCOUNTER — Other Ambulatory Visit (HOSPITAL_COMMUNITY): Payer: Self-pay

## 2023-11-03 NOTE — Progress Notes (Signed)
Assessment/Plan:   1.  Parkinsons Disease  -continue carbidopa/levodopa 25/100, 1 tablet 3 times per day.  -Patient understands that tremor is exacerbated by her lung medications, albuterol and Symbicort.  -We discussed that it used to be thought that levodopa would increase risk of melanoma but now it is believed that Parkinsons itself likely increases risk of melanoma. she is to get regular skin checks.  She does that.  -no objections from Parkinsons Disease standpoint for her shoulder surgery.  She is optimized from Parkinsons Disease standpoint.  Told her would prefer zofran over phenergan if needed for nausea.  Recommended she take her Parkinsons Disease medications day of surgery.   2.  Cervical myelopathy  -Status post multilevel fusion with Dr. Johnsie Cancel on December 16 2021.    3.  Depression  -Declines psychiatry referral  -on wellbutrin and sertraline now.  She is to make a follow upwith PCP who prescribes meds to see if change would be helpful.  I did not want to change medications that I do not prescribe.  4.  LBP with hip bursitis  -following with Dr. Maurice Small and he felt the issue was with her hip and told her to follow-up with her orthopedic surgeon, Dr. Everardo Pacific.  She did that and an injection into the bursa helped.      Subjective:   Briana [name] was seen today in follow up for Parkinsons disease.  My previous records were reviewed prior to todays visit as well as outside records available to me.  The last few visits, we have talked about depression.  Her primary care physician care physician has been managing.  She has declined referrals to psychiatry.   Separately, the patient just saw Dr. Maurice Small back on November 7 for increasing back and right leg pain.  He thought the issue was her hip and sent her back to Dr. Everardo Pacific, her orthopedic surgeon.  He noted that patient was going to follow back up with Dr. Jake Samples if things are not better.  She had an injection in  the hip for bursitis and its much better.  No falls.    Current prescribed movement disorder medications: Carbidopa/levodopa 25/100, 1 tablet 3 times per day.   PREVIOUS MEDICATIONS: Sinemet  ALLERGIES:   Allergies  Allergen Reactions   Erythromycin Base Nausea And Vomiting   Nitrofurantoin Macrocrystal Other (See Comments)    Unknown reaction    CURRENT MEDICATIONS:  Outpatient Encounter Medications as of 11/05/2023  Medication Sig   albuterol (VENTOLIN HFA) 108 (90 Base) MCG/ACT inhaler Inhale 1-2 puffs into the lungs every 6 (six) hours as needed.   atorvastatin (LIPITOR) 80 MG tablet Take 1 tablet (80 mg total) by mouth daily.   buPROPion (WELLBUTRIN XL) 150 MG 24 hr tablet Take 1 tablet (150 mg total) by mouth daily.   Calcium Carbonate-Vitamin D 600-5 MG-MCG TABS Take 1 tablet by mouth daily.   carbidopa-levodopa (SINEMET IR) 25-100 MG tablet Take 1 tablet by mouth 3 (three) times daily (9AM, 1PM, 5PM)   pantoprazole (PROTONIX) 40 MG tablet Take 1 tablet (40 mg total) by mouth 2 (two) times daily.   sertraline (ZOLOFT) 100 MG tablet Take 1.5 tablets (150 mg total) by mouth daily.   valsartan-hydrochlorothiazide (DIOVAN-HCT) 160-12.5 MG tablet Take 1 tablet by mouth daily.   [DISCONTINUED] pantoprazole (PROTONIX) 40 MG tablet Take 1 tablet (40 mg total) by mouth daily.   [DISCONTINUED] valsartan-hydrochlorothiazide (DIOVAN-HCT) 160-12.5 MG tablet Take 1 tablet by mouth daily.   No  facility-administered encounter medications on file as of 11/05/2023.    Objective:   PHYSICAL EXAMINATION:    VITALS:   Vitals:   11/05/23 0833  BP: 126/80  Pulse: 79  SpO2: 97%  Weight: 153 lb 12.8 oz (69.8 kg)  Height: 4\' 11"  (1.499 m)     Wt Readings from Last 3 Encounters:  11/05/23 153 lb 12.8 oz (69.8 kg)  04/28/23 154 lb 9.6 oz (70.1 kg)  12/02/22 151 lb 6.4 oz (68.7 kg)      GEN:  The patient appears stated age and is in NAD.  Tearful at times. HEENT:  Normocephalic,  atraumatic.  The mucous membranes are moist. The superficial temporal arteries are without ropiness or tenderness. CV: Regular rate and rhythm Lungs:  CTAB Neck/HEME:  There are no carotid bruits bilaterally.  Neurological examination:  Orientation: The patient is alert and oriented x3. Cranial nerves: There is good facial symmetry with facial hypomimia. The speech is fluent and clear. Soft palate rises symmetrically and there is no tongue deviation. Hearing is intact to conversational tone. Sensation: Sensation is intact to light touch throughout Motor: Strength is at least antigravity x4.  Movement examination: Tone: There is normal tone today Abnormal movements: there is rare tremor on the R Coordination:  There is slowness with RAM's but no decremation (same as previous) Gait and Station: The patient has pushes off of the chair to arise.  She is forward flexed.  Gait is antalgic (stable).  No tremor with ambulation  I have reviewed and interpreted the following labs independently    Chemistry      Component Value Date/Time   NA 138 12/12/2021 1153   K 3.6 12/12/2021 1153   CL 102 12/12/2021 1153   CO2 28 12/12/2021 1153   BUN 18 12/12/2021 1153   CREATININE 0.99 12/12/2021 1153      Component Value Date/Time   CALCIUM 9.3 12/12/2021 1153   ALKPHOS 44 12/12/2021 1153   AST 43 (H) 12/12/2021 1153   ALT 6 12/12/2021 1153   BILITOT 0.9 12/12/2021 1153       Lab Results  Component Value Date   WBC 6.9 12/12/2021   HGB 11.9 (L) 12/12/2021   HCT 36.9 12/12/2021   MCV 87.4 12/12/2021   PLT 218 12/12/2021    No results found for: "TSH"     Cc:  Irven Coe, MD

## 2023-11-05 ENCOUNTER — Ambulatory Visit (INDEPENDENT_AMBULATORY_CARE_PROVIDER_SITE_OTHER): Payer: HMO | Admitting: Neurology

## 2023-11-05 ENCOUNTER — Encounter: Payer: Self-pay | Admitting: Neurology

## 2023-11-05 VITALS — BP 126/80 | HR 79 | Ht 59.0 in | Wt 153.8 lb

## 2023-11-05 DIAGNOSIS — G20A1 Parkinson's disease without dyskinesia, without mention of fluctuations: Secondary | ICD-10-CM

## 2023-11-05 NOTE — Patient Instructions (Signed)
 Here are some resources/books that you may find helpful as you navigate the challenges of Parkinson's Disease  1.  Parkinson's treatement: 10 secrets to a happier life by Jefferey Pica, MD 2.  Navigating Life with Parkinsons disease by Sotirios Parashos 3.  My degeneration: A journey through Parkinsons Ledora Bottcher - [place]) 4.  Every Victory counts (I believe this one if free through [employer]) 5.  Lucky Man by Gardner Candle 6.  101 Questions & Answers about Parkinson's by Caprice Renshaw 7.  Parkinsons Disease Treatment Book by JE Ahiskog  The physicians and staff at [facility] are committed to providing excellent care. You may receive a survey requesting feedback about your experience at our office. We strive to receive "very good" responses to the survey questions. If you feel that your experience would prevent you from giving the office a "very good " response, please contact our office to try to remedy the situation. We may be reached at [phone]. Thank you for taking the time out of your busy day to complete the survey.

## 2023-11-06 ENCOUNTER — Other Ambulatory Visit (HOSPITAL_COMMUNITY): Payer: Self-pay

## 2023-11-08 ENCOUNTER — Other Ambulatory Visit: Payer: Self-pay

## 2023-11-08 ENCOUNTER — Other Ambulatory Visit (HOSPITAL_COMMUNITY): Payer: Self-pay

## 2023-11-08 MED ORDER — PANTOPRAZOLE SODIUM 40 MG PO TBEC
40.0000 mg | DELAYED_RELEASE_TABLET | Freq: Two times a day (BID) | ORAL | 1 refills | Status: DC
  Filled 2023-11-08 (×2): qty 60, 30d supply, fill #0
  Filled 2023-12-07: qty 60, 30d supply, fill #1

## 2023-11-09 ENCOUNTER — Other Ambulatory Visit (HOSPITAL_COMMUNITY): Payer: Self-pay

## 2023-12-07 ENCOUNTER — Other Ambulatory Visit (HOSPITAL_COMMUNITY): Payer: Self-pay

## 2023-12-07 ENCOUNTER — Other Ambulatory Visit: Payer: Self-pay | Admitting: Neurology

## 2023-12-07 ENCOUNTER — Other Ambulatory Visit: Payer: Self-pay

## 2023-12-07 DIAGNOSIS — G20A1 Parkinson's disease without dyskinesia, without mention of fluctuations: Secondary | ICD-10-CM

## 2023-12-07 MED ORDER — BUPROPION HCL ER (XL) 150 MG PO TB24
150.0000 mg | ORAL_TABLET | Freq: Every morning | ORAL | 1 refills | Status: DC
  Filled 2023-12-07 – 2024-02-08 (×4): qty 90, 90d supply, fill #0
  Filled 2024-05-04: qty 90, 90d supply, fill #1

## 2023-12-08 ENCOUNTER — Other Ambulatory Visit: Payer: Self-pay

## 2023-12-08 ENCOUNTER — Other Ambulatory Visit (HOSPITAL_COMMUNITY): Payer: Self-pay

## 2023-12-08 MED ORDER — CARBIDOPA-LEVODOPA 25-100 MG PO TABS
1.0000 | ORAL_TABLET | Freq: Three times a day (TID) | ORAL | 0 refills | Status: DC
  Filled 2023-12-08: qty 270, 90d supply, fill #0

## 2023-12-09 ENCOUNTER — Other Ambulatory Visit (HOSPITAL_COMMUNITY): Payer: Self-pay

## 2023-12-09 ENCOUNTER — Other Ambulatory Visit: Payer: Self-pay

## 2023-12-10 ENCOUNTER — Other Ambulatory Visit (HOSPITAL_COMMUNITY): Payer: Self-pay

## 2023-12-10 MED ORDER — VALSARTAN-HYDROCHLOROTHIAZIDE 160-12.5 MG PO TABS
1.0000 | ORAL_TABLET | Freq: Every day | ORAL | 1 refills | Status: DC
  Filled 2023-12-10 – 2024-02-18 (×4): qty 90, 90d supply, fill #0
  Filled 2024-06-25: qty 90, 90d supply, fill #1

## 2024-01-04 ENCOUNTER — Other Ambulatory Visit (HOSPITAL_COMMUNITY): Payer: Self-pay

## 2024-01-08 ENCOUNTER — Other Ambulatory Visit (HOSPITAL_COMMUNITY): Payer: Self-pay

## 2024-01-11 ENCOUNTER — Other Ambulatory Visit (HOSPITAL_COMMUNITY): Payer: Self-pay

## 2024-01-11 MED ORDER — PANTOPRAZOLE SODIUM 40 MG PO TBEC
40.0000 mg | DELAYED_RELEASE_TABLET | Freq: Two times a day (BID) | ORAL | 0 refills | Status: DC
  Filled 2024-01-11: qty 200, 100d supply, fill #0

## 2024-02-08 ENCOUNTER — Other Ambulatory Visit (HOSPITAL_COMMUNITY): Payer: Self-pay

## 2024-02-09 ENCOUNTER — Other Ambulatory Visit: Payer: Self-pay

## 2024-02-18 ENCOUNTER — Other Ambulatory Visit: Payer: Self-pay | Admitting: Neurology

## 2024-02-18 ENCOUNTER — Other Ambulatory Visit: Payer: Self-pay

## 2024-02-18 DIAGNOSIS — G20A1 Parkinson's disease without dyskinesia, without mention of fluctuations: Secondary | ICD-10-CM

## 2024-02-21 ENCOUNTER — Other Ambulatory Visit: Payer: Self-pay

## 2024-02-21 MED ORDER — CARBIDOPA-LEVODOPA 25-100 MG PO TABS
1.0000 | ORAL_TABLET | Freq: Three times a day (TID) | ORAL | 0 refills | Status: DC
  Filled 2024-02-21: qty 270, 90d supply, fill #0

## 2024-02-22 DIAGNOSIS — M7061 Trochanteric bursitis, right hip: Secondary | ICD-10-CM | POA: Diagnosis not present

## 2024-02-22 DIAGNOSIS — M25551 Pain in right hip: Secondary | ICD-10-CM | POA: Diagnosis not present

## 2024-02-22 DIAGNOSIS — M545 Low back pain, unspecified: Secondary | ICD-10-CM | POA: Diagnosis not present

## 2024-02-28 DIAGNOSIS — M7061 Trochanteric bursitis, right hip: Secondary | ICD-10-CM | POA: Diagnosis not present

## 2024-03-08 DIAGNOSIS — S76311D Strain of muscle, fascia and tendon of the posterior muscle group at thigh level, right thigh, subsequent encounter: Secondary | ICD-10-CM | POA: Diagnosis not present

## 2024-03-08 DIAGNOSIS — M47896 Other spondylosis, lumbar region: Secondary | ICD-10-CM | POA: Diagnosis not present

## 2024-03-15 DIAGNOSIS — M5416 Radiculopathy, lumbar region: Secondary | ICD-10-CM | POA: Diagnosis not present

## 2024-03-20 ENCOUNTER — Ambulatory Visit: Payer: Self-pay | Admitting: Adult Health

## 2024-03-20 NOTE — Telephone Encounter (Signed)
  Chief Complaint: Shortness of breath Symptoms: SOB with activity Frequency: for over a month Pertinent Negatives: Patient denies fever, CP Disposition: [] ED /[] Urgent Care (no appt availability in office) / [x] Appointment(In office/virtual)/ []  [facility]/ [] Home Care/ [] Refused Recommended Disposition /[] [facility]/ []  Follow-up with PCP Additional Notes: patient called with concerns for shortness of breath over the last month. Patient endorses shortness of breath with activity where she has be give herself time to recover. Patient was calling with the purpose of being seen earlier than April 21 which is the appointment that was scheduled. Patient scheduled for tomorrow at 11:30 AM. ED precautions were given to patient if symptoms become worse before her appointment. Patient verbalized understanding of plan and all questions answered.    Copied from CRM [id number]. Topic: Clinical - Red Word Triage >> Mar 20, 2024  1:00 PM Dustin F wrote: Red Word that prompted transfer to Nurse Triage: shortness of breath for months Reason for Disposition  [1] MODERATE longstanding difficulty breathing (e.g., speaks in phrases, SOB even at rest, pulse 100-120) AND [2] SAME as normal  Answer Assessment - Initial Assessment Questions 1. RESPIRATORY STATUS: "Describe your breathing?" (e.g., wheezing, shortness of breath, unable to speak, severe coughing)      Shortness of breath 2. ONSET: "When did this breathing problem begin?"      Started about a month ago 3. PATTERN "Does the difficult breathing come and go, or has it been constant since it started?"      Comes and goes 4. SEVERITY: "How bad is your breathing?" (e.g., mild, moderate, severe)    - MILD: No SOB at rest, mild SOB with walking, speaks normally in sentences, can lie down, no retractions, pulse < 100.    - MODERATE: SOB at rest, SOB with minimal exertion and prefers to sit, cannot lie down flat, speaks in phrases, mild  retractions, audible wheezing, pulse 100-120.    - SEVERE: Very SOB at rest, speaks in single words, struggling to breathe, sitting hunched forward, retractions, pulse > 120      Mild-with no movement but worse with any movement 5. RECURRENT SYMPTOM: "Have you had difficulty breathing before?" If Yes, ask: "When was the last time?" and "What happened that time?"      Yes 6. CARDIAC HISTORY: "Do you have any history of heart disease?" (e.g., heart attack, angina, bypass surgery, angioplasty)      no 7. LUNG HISTORY: "Do you have any history of lung disease?"  (e.g., pulmonary embolus, asthma, emphysema)     Lung disease 8. CAUSE: "What do you think is causing the breathing problem?"      unsure 9. OTHER SYMPTOMS: "Do you have any other symptoms? (e.g., dizziness, runny nose, cough, chest pain, fever)     Possible congestion but no other symptoms 10. O2 SATURATION MONITOR:  "Do you use an oxygen saturation monitor (pulse oximeter) at home?" If Yes, ask: "What is your reading (oxygen level) today?" "What is your usual oxygen saturation reading?" (e.g., 95%)       Unable to check 12. TRAVEL: "Have you traveled out of the country in the last month?" (e.g., travel history, exposures)       no  Protocols used: Breathing Difficulty-A-AH

## 2024-03-21 ENCOUNTER — Ambulatory Visit (INDEPENDENT_AMBULATORY_CARE_PROVIDER_SITE_OTHER): Admitting: Adult Health

## 2024-03-21 ENCOUNTER — Encounter: Payer: Self-pay | Admitting: Adult Health

## 2024-03-21 ENCOUNTER — Ambulatory Visit (INDEPENDENT_AMBULATORY_CARE_PROVIDER_SITE_OTHER)

## 2024-03-21 ENCOUNTER — Other Ambulatory Visit (HOSPITAL_COMMUNITY): Payer: Self-pay

## 2024-03-21 VITALS — BP 122/76 | HR 99 | Temp 98.7°F | Ht 59.0 in | Wt 163.4 lb

## 2024-03-21 DIAGNOSIS — J4530 Mild persistent asthma, uncomplicated: Secondary | ICD-10-CM | POA: Diagnosis not present

## 2024-03-21 DIAGNOSIS — J4521 Mild intermittent asthma with (acute) exacerbation: Secondary | ICD-10-CM | POA: Diagnosis not present

## 2024-03-21 DIAGNOSIS — R059 Cough, unspecified: Secondary | ICD-10-CM | POA: Diagnosis not present

## 2024-03-21 DIAGNOSIS — R0602 Shortness of breath: Secondary | ICD-10-CM | POA: Diagnosis not present

## 2024-03-21 DIAGNOSIS — Z981 Arthrodesis status: Secondary | ICD-10-CM | POA: Diagnosis not present

## 2024-03-21 DIAGNOSIS — J45909 Unspecified asthma, uncomplicated: Secondary | ICD-10-CM | POA: Diagnosis not present

## 2024-03-21 MED ORDER — PREDNISONE 20 MG PO TABS
20.0000 mg | ORAL_TABLET | Freq: Every day | ORAL | 0 refills | Status: DC
  Filled 2024-03-21 (×2): qty 5, 5d supply, fill #0

## 2024-03-21 NOTE — Patient Instructions (Addendum)
 Chest xray today. Prednisone 20mg  daily for 5 days  Delsym 2 tsp Twice daily  As needed  cough .  Albuterol inhaler As needed   Continue on Protonix daily  Follow up as planned in 3 weeks and As needed    Please contact office for sooner follow up if symptoms do not improve or worsen or seek emergency care

## 2024-03-21 NOTE — Progress Notes (Signed)
 @Patient  ID: Briana [name], female    DOB: [birth year], 78 y.o.   MRN: [id number]  Chief Complaint  Patient presents with   Acute Visit    Referring provider: Irven Coe, MD  HPI: 78 year old female minimal smoking history followed for shortness of breath and mild intermittent asthma, abnormal CT chest Medical history significant for Parkinson's disease and cervical myelopathy  TEST/EVENTS :  Peripheral basilar groundglass infiltrates on HR CT chest in 05/2021 concerning for early ILD especially NSIP. - 02/2022 repeat CT chest showed mosaic pattern more consistent with small vessel disease or bronchiolitis PFTs have shown mild restriction with normal DLCO  03/21/2024 Acute OV : Cough and Wheezing /Asthma  Discussed the use of AI scribe software for clinical note transcription with the patient, who gave verbal consent to proceed.  History of Present Illness   Briana [name] is a 78 year old female with asthma who presents with wheezing and respiratory difficulties.  She has been experiencing wheezing for the past couple of months, along with dry cough.  Initially, she had some congestion, which has persisted. She has not used much albuterol prior to this episode but has used it recently, questioning its expiration date. She has not taken prednisone before. A past breathing test in 2022 was normal, and there have been no recent chest x-rays.  She has a history of Parkinson's disease and has been participating in cycling activities, which she found beneficial for her breathing. However, due to back and hip issues, she has stopped cycling and has gained weight. She received steroid injections for her back and hips. She continues to take Sinemet for Parkinson's disease.  She is experiencing indigestion and is scheduled for an endoscopy. She takes Protonix for indigestion and denies any swallowing difficulties. In the review of symptoms, she reports occasional runny nose but no significant  sinus drainage. No vomiting or trouble swallowing.       Allergies  Allergen Reactions   Erythromycin Base Nausea And Vomiting   Nitrofurantoin Macrocrystal Other (See Comments)    Unknown reaction    Immunization History  Administered Date(s) Administered   Fluad Quad(high Dose 65+) 10/09/2021   Influenza, High Dose Seasonal PF 10/17/2015, 09/25/2016, 09/28/2017, 09/07/2018, 09/20/2020   PFIZER(Purple Top)SARS-COV-2 Vaccination 01/10/2020, 01/31/2020, 10/02/2020    Past Medical History:  Diagnosis Date   Cancer (HCC)    Basal Cell Carcinoma   Complication of anesthesia    Depression    Diabetes mellitus without complication (HCC)    Dyspnea    Pt sees pulm-Dr. Vassie Loll   Fatty liver    High cholesterol    Hypertension    Parkinson disease (HCC)    PONV (postoperative nausea and vomiting)    After a colonoscopy procedure x1    Tobacco History: Social History   Tobacco Use  Smoking Status Former  Smokeless Tobacco Never   Counseling given: Not Answered   Outpatient Medications Prior to Visit  Medication Sig Dispense Refill   atorvastatin (LIPITOR) 80 MG tablet Take 1 tablet (80 mg total) by mouth daily. 90 tablet 1   buPROPion (WELLBUTRIN XL) 150 MG 24 hr tablet Take 1 tablet (150 mg total) by mouth in the morning. 90 tablet 1   Calcium Carbonate-Vitamin D 600-5 MG-MCG TABS Take 1 tablet by mouth daily.     carbidopa-levodopa (SINEMET IR) 25-100 MG tablet Take 1 tablet by mouth 3 (three) times daily (9AM, 1PM, 5PM) 270 tablet 0   pantoprazole (PROTONIX) 40 MG tablet Take  1 tablet (40 mg total) by mouth 2 (two) times daily. 200 tablet 0   sertraline (ZOLOFT) 100 MG tablet Take 1.5 tablets (150 mg total) by mouth daily. 135 tablet 3   valsartan-hydrochlorothiazide (DIOVAN-HCT) 160-12.5 MG tablet Take 1 tablet by mouth daily. 90 tablet 1   albuterol (VENTOLIN HFA) 108 (90 Base) MCG/ACT inhaler Inhale 1-2 puffs into the lungs every 6 (six) hours as needed. (Patient not  taking: Reported on 03/21/2024) 6.7 g 2   No facility-administered medications prior to visit.     Review of Systems:   Constitutional:   No  weight loss, night sweats,  Fevers, chills,+ fatigue, or  lassitude.  HEENT:   No headaches,  Difficulty swallowing,  Tooth/dental problems, or  Sore throat,                No sneezing, itching, ear ache, +nasal congestion, post nasal drip,   CV:  No chest pain,  Orthopnea, PND, swelling in lower extremities, anasarca, dizziness, palpitations, syncope.   GI  No heartburn, indigestion, abdominal pain, nausea, vomiting, diarrhea, change in bowel habits, loss of appetite, bloody stools.   Resp: No shortness of breath with exertion or at rest.  No excess mucus, no productive cough,  No non-productive cough,  No coughing up of blood.  No change in color of mucus.  No wheezing.  No chest wall deformity  Skin: no rash or lesions.  GU: no dysuria, change in color of urine, no urgency or frequency.  No flank pain, no hematuria   MS:  No joint pain or swelling.  No decreased range of motion.  No back pain.    Physical Exam  BP 122/76   Pulse 99   Temp 98.7 F (37.1 C)   Ht 4\' 11"  (1.499 m)   Wt 163 lb 6.4 oz (74.1 kg)   SpO2 96%   BMI 33.00 kg/m   GEN: A/Ox3; pleasant , NAD, well nourished    HEENT:  [place]/AT,   NOSE-clear, THROAT-clear, no lesions, no postnasal drip or exudate noted.   NECK:  Supple w/ fair ROM; no JVD; normal carotid impulses w/o bruits; no thyromegaly or nodules palpated; no lymphadenopathy.    RESP  Clear  P & A; w/o, wheezes/ rales/ or rhonchi. no accessory muscle use, no dullness to percussion  CARD:  RRR, no m/r/g, no peripheral edema, pulses intact, no cyanosis or clubbing.  GI:   Soft & nt; nml bowel sounds; no organomegaly or masses detected.   Musco: Warm bil, no deformities or joint swelling noted.   Neuro: alert, no focal deficits noted.    Skin: Warm, no lesions or rashes    Lab  Results:  CBC   BNP No results found for: "BNP"  ProBNP No results found for: "PROBNP"  Imaging: No results found.  Administration History     None          Latest Ref Rng & Units 05/07/2021   10:51 AM  PFT Results  FVC-Pre L 1.42   FVC-Predicted Pre % 60   FVC-Post L 1.38   FVC-Predicted Post % 59   Pre FEV1/FVC % % 85   Post FEV1/FCV % % 87   FEV1-Pre L 1.20   FEV1-Predicted Pre % 69   FEV1-Post L 1.20   DLCO uncorrected ml/min/mmHg 18.10   DLCO UNC% % 108   DLCO corrected ml/min/mmHg 18.10   DLCO COR %Predicted % 108   DLVA Predicted % 153   TLC L  3.51   TLC % Predicted % 78   RV % Predicted % 88     No results found for: "NITRICOXIDE"      Assessment & Plan:     Assessment and Plan    Asthma exacerbation   She presents with a two-month history of wheezing, recently worsened. Her mild intermittent asthma was previously managed with as-needed albuterol. The exacerbation may be related to a recent viral infections.  Wheezing is noted on examination. Order a chest x-ray. Prescribe prednisone for 5 days to manage inflammation and reduce wheezing. Administer a breathing treatment with albuterol. Advise the use of Delsym for cough. Refill albuterol inhaler as needed.  Gastroesophageal reflux disease (GERD)   She reports indigestion and is scheduled for endoscopy. Currently on Protonix for GERD management. She is advised to discuss the asthma flare with the GI team, as it may impact endoscopy timing due to sedation risks. Continue Protonix. Discuss asthma flare with the GI team before endoscopy.     Rubye Oaks, NP 03/21/2024

## 2024-03-22 DIAGNOSIS — K219 Gastro-esophageal reflux disease without esophagitis: Secondary | ICD-10-CM | POA: Diagnosis not present

## 2024-03-22 DIAGNOSIS — K449 Diaphragmatic hernia without obstruction or gangrene: Secondary | ICD-10-CM | POA: Diagnosis not present

## 2024-03-22 DIAGNOSIS — K2961 Other gastritis with bleeding: Secondary | ICD-10-CM | POA: Diagnosis not present

## 2024-03-22 DIAGNOSIS — K319 Disease of stomach and duodenum, unspecified: Secondary | ICD-10-CM | POA: Diagnosis not present

## 2024-03-24 DIAGNOSIS — K319 Disease of stomach and duodenum, unspecified: Secondary | ICD-10-CM | POA: Diagnosis not present

## 2024-03-27 ENCOUNTER — Ambulatory Visit: Payer: HMO | Admitting: Adult Health

## 2024-03-27 DIAGNOSIS — S76311D Strain of muscle, fascia and tendon of the posterior muscle group at thigh level, right thigh, subsequent encounter: Secondary | ICD-10-CM | POA: Diagnosis not present

## 2024-03-27 DIAGNOSIS — M47896 Other spondylosis, lumbar region: Secondary | ICD-10-CM | POA: Diagnosis not present

## 2024-04-10 ENCOUNTER — Other Ambulatory Visit (HOSPITAL_COMMUNITY): Payer: Self-pay

## 2024-04-10 ENCOUNTER — Encounter: Payer: Self-pay | Admitting: Adult Health

## 2024-04-10 ENCOUNTER — Ambulatory Visit (INDEPENDENT_AMBULATORY_CARE_PROVIDER_SITE_OTHER): Payer: HMO | Admitting: Adult Health

## 2024-04-10 VITALS — BP 140/80 | HR 113 | Temp 97.9°F | Ht <= 58 in | Wt 165.4 lb

## 2024-04-10 DIAGNOSIS — R911 Solitary pulmonary nodule: Secondary | ICD-10-CM | POA: Diagnosis not present

## 2024-04-10 DIAGNOSIS — R0609 Other forms of dyspnea: Secondary | ICD-10-CM

## 2024-04-10 DIAGNOSIS — J4530 Mild persistent asthma, uncomplicated: Secondary | ICD-10-CM

## 2024-04-10 MED ORDER — FLUTICASONE FUROATE-VILANTEROL 100-25 MCG/ACT IN AEPB
1.0000 | INHALATION_SPRAY | Freq: Every day | RESPIRATORY_TRACT | 5 refills | Status: DC
  Filled 2024-04-10: qty 60, 30d supply, fill #0
  Filled 2024-05-08: qty 60, 30d supply, fill #1
  Filled 2024-06-25: qty 60, 30d supply, fill #2

## 2024-04-10 NOTE — Patient Instructions (Addendum)
 Begin BREO 1 puff daily, rinse after use  Albuterol  inhaler As needed  Delsym 2 tsp Twice daily  As needed  cough .  Continue on Protonix  daily  Claritin 10mg  daily As needed  drainage .  Follow up in 6-8 weeks with PFT and As needed   Please contact office for sooner follow up if symptoms do not improve or worsen or seek emergency care

## 2024-04-10 NOTE — Progress Notes (Signed)
 @Patient  ID: Briana [name], female    DOB: 1946/03/07, 78 y.o.   MRN: [id number]  Chief Complaint  Patient presents with   Follow-up    Referring provider: Benedetto Brady, MD  HPI: 78 year old female with minimum smoking history followed for shortness of breath and mild intermittent asthma, abnormal CT chest Medical history significant for Parkinson's disease and cervical myelopathy  TEST/EVENTS :  Peripheral basilar groundglass infiltrates on HR CT chest in 05/2021 concerning for early ILD especially NSIP. - High-resolution CT chest March 2023 showed biapical pleural-parenchymal scarring, subpleural groundglass in the lateral right lower lobe, thin-walled cyst left apical unchanged measuring 10 x 12 mm-since 2019.  Negative for subpleural reticulation, bronchiectasis or honeycombing. PFTs 2022 have shown Moderate restriction with normal to elevated DLCO  04/10/2024 Follow up : Asthma, Dyspnea , Abnormal CT chest  Patient presents for 3-week follow-up.  Patient was seen last visit with a mild asthma flare with cough and wheezing.  She was given prednisone  20 mg for 5 days.  Chest x-ray last visit showed clear lungs.  She says that cough and wheezing are better.  She continues to get short of breath with minimum activity.  She is still able to do her housework and cook.  But is limited by chronic back and hip pain.  She is currently working with physical therapy.  She also has scoliosis.  She denies any fever, discolored mucus, chest pain, orthopnea, edema or hemoptysis.  Has an occasional dry cough and intermittent wheezing, mild post nasal drainage and stuffy nose.  Last PFT showed moderate restriction.  Followed for Left apical thin walled cyst stable on serial CT chest since 2019.  High resolution CT chest in March 2023 showed stable biapical pleural scarring and some pleural groundglass in the lateral right lower lobe.  Negative for ILD.Aaron Aas        Allergies  Allergen Reactions    Erythromycin Base Nausea And Vomiting   Nitrofurantoin Macrocrystal Other (See Comments)    Unknown reaction    Immunization History  Administered Date(s) Administered   Fluad Quad(high Dose 65+) 10/09/2021   Influenza, High Dose Seasonal PF 10/17/2015, 09/25/2016, 09/28/2017, 09/07/2018, 09/20/2020   PFIZER(Purple Top)SARS-COV-2 Vaccination 01/10/2020, 01/31/2020, 10/02/2020    Past Medical History:  Diagnosis Date   Cancer (HCC)    Basal Cell Carcinoma   Complication of anesthesia    Depression    Diabetes mellitus without complication (HCC)    Dyspnea    Pt sees pulm-Dr. Villa Greaser   Fatty liver    High cholesterol    Hypertension    Parkinson disease (HCC)    PONV (postoperative nausea and vomiting)    After a colonoscopy procedure x1    Tobacco History: Social History   Tobacco Use  Smoking Status Former  Smokeless Tobacco Never   Counseling given: Not Answered   Outpatient Medications Prior to Visit  Medication Sig Dispense Refill   albuterol  (VENTOLIN  HFA) 108 (90 Base) MCG/ACT inhaler Inhale 1-2 puffs into the lungs every 6 (six) hours as needed. 6.7 g 2   atorvastatin  (LIPITOR) 80 MG tablet Take 1 tablet (80 mg total) by mouth daily. 90 tablet 1   buPROPion  (WELLBUTRIN  XL) 150 MG 24 hr tablet Take 1 tablet (150 mg total) by mouth in the morning. 90 tablet 1   Calcium  Carbonate-Vitamin D 600-5 MG-MCG TABS Take 1 tablet by mouth daily.     carbidopa -levodopa  (SINEMET  IR) 25-100 MG tablet Take 1 tablet by mouth 3 (three) times  daily (9AM, 1PM, 5PM) 270 tablet 0   pantoprazole  (PROTONIX ) 40 MG tablet Take 1 tablet (40 mg total) by mouth 2 (two) times daily. 200 tablet 0   predniSONE  (DELTASONE ) 20 MG tablet Take 1 tablet (20 mg total) by mouth daily with breakfast. 5 tablet 0   sertraline  (ZOLOFT ) 100 MG tablet Take 1.5 tablets (150 mg total) by mouth daily. 135 tablet 3   valsartan -hydrochlorothiazide  (DIOVAN -HCT) 160-12.5 MG tablet Take 1 tablet by mouth daily. 90  tablet 1   No facility-administered medications prior to visit.     Review of Systems:   Constitutional:   No  weight loss, night sweats,  Fevers, chills,+ fatigue, or  lassitude.  HEENT:   No headaches,  Difficulty swallowing,  Tooth/dental problems, or  Sore throat,                No sneezing, itching, ear ache, nasal congestion, post nasal drip,   CV:  No chest pain,  Orthopnea, PND, swelling in lower extremities, anasarca, dizziness, palpitations, syncope.   GI  No heartburn, indigestion, abdominal pain, nausea, vomiting, diarrhea, change in bowel habits, loss of appetite, bloody stools.   Resp: .  No chest wall deformity  Skin: no rash or lesions.  GU: no dysuria, change in color of urine, no urgency or frequency.  No flank pain, no hematuria   MS:  +back/hip pain -chronic    Physical Exam  BP (!) 140/80 (BP Location: Left Arm, Patient Position: Sitting, Cuff Size: Normal)   Pulse (!) 113   Temp 97.9 F (36.6 C) (Oral)   Ht 4\' 10"  (1.473 m)   Wt 165 lb 6.4 oz (75 kg)   SpO2 100%   BMI 34.57 kg/m   GEN: A/Ox3; pleasant , NAD, well nourished    HEENT:  [place]/AT,  NOSE-clear, THROAT-clear, no lesions, no postnasal drip or exudate noted.   NECK:  Supple w/ fair ROM; no JVD; normal carotid impulses w/o bruits; no thyromegaly or nodules palpated; no lymphadenopathy.    RESP  Clear  P & A; w/o, wheezes/ rales/ or rhonchi. no accessory muscle use, no dullness to percussion  CARD:  RRR, no m/r/g, no peripheral edema, pulses intact, no cyanosis or clubbing.  GI:   Soft & nt; nml bowel sounds; no organomegaly or masses detected.   Musco: Warm bil, no deformities or joint swelling noted. +kyphosis   Neuro: alert, no focal deficits noted.    Skin: Warm, no lesions or rashes    Lab Results:  CBC    Component Value Date/Time   WBC 6.9 12/12/2021 1153   RBC 4.22 12/12/2021 1153   HGB 11.9 (L) 12/12/2021 1153   HCT 36.9 12/12/2021 1153   PLT 218 12/12/2021 1153    MCV 87.4 12/12/2021 1153   MCH 28.2 12/12/2021 1153   MCHC 32.2 12/12/2021 1153   RDW 15.3 12/12/2021 1153    BMET    Component Value Date/Time   NA 138 12/12/2021 1153   K 3.6 12/12/2021 1153   CL 102 12/12/2021 1153   CO2 28 12/12/2021 1153   GLUCOSE 114 (H) 12/12/2021 1153   BUN 18 12/12/2021 1153   CREATININE 0.99 12/12/2021 1153   CALCIUM  9.3 12/12/2021 1153   GFRNONAA 59 (L) 12/12/2021 1153    BNP No results found for: "BNP"  ProBNP No results found for: "PROBNP"  Imaging: DG Chest 2 View Result Date: 03/21/2024 CLINICAL DATA:  Cough and shortness of breath.  Asthma. EXAM: CHEST -  2 VIEW COMPARISON:  04/09/2021 FINDINGS: The heart size and mediastinal contours are within normal limits. Both lungs are clear. Old wedge compression fractures of the T8 and T12 vertebral bodies are again seen. Cervical spine fusion hardware and right shoulder prosthesis noted. IMPRESSION: No active cardiopulmonary disease. Electronically Signed   By: Marlyce Sine M.D.   On: 03/21/2024 14:48    Administration History     None          Latest Ref Rng & Units 05/07/2021   10:51 AM  PFT Results  FVC-Pre L 1.42   FVC-Predicted Pre % 60   FVC-Post L 1.38   FVC-Predicted Post % 59   Pre FEV1/FVC % % 85   Post FEV1/FCV % % 87   FEV1-Pre L 1.20   FEV1-Predicted Pre % 69   FEV1-Post L 1.20   DLCO uncorrected ml/min/mmHg 18.10   DLCO UNC% % 108   DLCO corrected ml/min/mmHg 18.10   DLCO COR %Predicted % 108   DLVA Predicted % 153   TLC L 3.51   TLC % Predicted % 78   RV % Predicted % 88     No results found for: "NITRICOXIDE"      Assessment & Plan:   No problem-specific Assessment & Plan notes found for this encounter.     Roena Clark, NP 04/10/2024

## 2024-04-11 DIAGNOSIS — S76311D Strain of muscle, fascia and tendon of the posterior muscle group at thigh level, right thigh, subsequent encounter: Secondary | ICD-10-CM | POA: Diagnosis not present

## 2024-04-11 DIAGNOSIS — M47896 Other spondylosis, lumbar region: Secondary | ICD-10-CM | POA: Diagnosis not present

## 2024-04-11 NOTE — Assessment & Plan Note (Signed)
 Stable on serial CT chest 2023-no significant change since 2019

## 2024-04-11 NOTE — Assessment & Plan Note (Signed)
 Mild asthmatic flare improved with short prednisone  burst.  Trial of ICS/LABA inhaler-Breo to help with symptom management.  Asthma action plan discussed.  Albuterol  as needed control for triggers. Check PFTs on return  Plan  Patient Instructions  Begin BREO 1 puff daily, rinse after use  Albuterol  inhaler As needed  Delsym 2 tsp Twice daily  As needed  cough .  Continue on Protonix  daily  Claritin 10mg  daily As needed  drainage .  Follow up in 6-8 weeks with PFT and As needed   Please contact office for sooner follow up if symptoms do not improve or worsen or seek emergency care

## 2024-04-11 NOTE — Assessment & Plan Note (Signed)
 Chronic dyspnea with activity suspect is multifactorial.  She is limited by chronic back pain and hip pain.  Suspect physical deconditioning is a contributing factor.  Chest x-ray showed clear lungs with no acute process.  Previous PFTs showed moderate restriction.  Will repeat on return visit.  Previous high-resolution CT chest was negative for ILD.  Most recent CT chest 2023 showed stable scarring.  Exam is unrevealing with no evidence of acute volume overload.  Previous echo in 2021 showed preserved EF and mild LVH.  Consider labs with CBC and BMP on return if symptoms persist.  Trial of Breo for possible asthma component  Plan  Patient Instructions  Begin BREO 1 puff daily, rinse after use  Albuterol  inhaler As needed  Delsym 2 tsp Twice daily  As needed  cough .  Continue on Protonix  daily  Claritin 10mg  daily As needed  drainage .  Follow up in 6-8 weeks with PFT and As needed   Please contact office for sooner follow up if symptoms do not improve or worsen or seek emergency care

## 2024-04-13 DIAGNOSIS — M5416 Radiculopathy, lumbar region: Secondary | ICD-10-CM | POA: Diagnosis not present

## 2024-04-26 DIAGNOSIS — H43393 Other vitreous opacities, bilateral: Secondary | ICD-10-CM | POA: Diagnosis not present

## 2024-04-26 DIAGNOSIS — H40033 Anatomical narrow angle, bilateral: Secondary | ICD-10-CM | POA: Diagnosis not present

## 2024-04-28 DIAGNOSIS — M545 Low back pain, unspecified: Secondary | ICD-10-CM | POA: Diagnosis not present

## 2024-05-01 ENCOUNTER — Encounter: Payer: Self-pay | Admitting: Pharmacist

## 2024-05-01 ENCOUNTER — Other Ambulatory Visit (HOSPITAL_COMMUNITY): Payer: Self-pay

## 2024-05-01 ENCOUNTER — Other Ambulatory Visit: Payer: Self-pay

## 2024-05-01 DIAGNOSIS — Z85828 Personal history of other malignant neoplasm of skin: Secondary | ICD-10-CM | POA: Diagnosis not present

## 2024-05-01 DIAGNOSIS — C44719 Basal cell carcinoma of skin of left lower limb, including hip: Secondary | ICD-10-CM | POA: Diagnosis not present

## 2024-05-01 DIAGNOSIS — L720 Epidermal cyst: Secondary | ICD-10-CM | POA: Diagnosis not present

## 2024-05-01 DIAGNOSIS — D224 Melanocytic nevi of scalp and neck: Secondary | ICD-10-CM | POA: Diagnosis not present

## 2024-05-01 DIAGNOSIS — D225 Melanocytic nevi of trunk: Secondary | ICD-10-CM | POA: Diagnosis not present

## 2024-05-01 DIAGNOSIS — L02211 Cutaneous abscess of abdominal wall: Secondary | ICD-10-CM | POA: Diagnosis not present

## 2024-05-01 DIAGNOSIS — L820 Inflamed seborrheic keratosis: Secondary | ICD-10-CM | POA: Diagnosis not present

## 2024-05-01 DIAGNOSIS — L821 Other seborrheic keratosis: Secondary | ICD-10-CM | POA: Diagnosis not present

## 2024-05-01 DIAGNOSIS — D485 Neoplasm of uncertain behavior of skin: Secondary | ICD-10-CM | POA: Diagnosis not present

## 2024-05-01 DIAGNOSIS — D1801 Hemangioma of skin and subcutaneous tissue: Secondary | ICD-10-CM | POA: Diagnosis not present

## 2024-05-01 DIAGNOSIS — D692 Other nonthrombocytopenic purpura: Secondary | ICD-10-CM | POA: Diagnosis not present

## 2024-05-01 DIAGNOSIS — L98491 Non-pressure chronic ulcer of skin of other sites limited to breakdown of skin: Secondary | ICD-10-CM | POA: Diagnosis not present

## 2024-05-01 MED ORDER — DOXYCYCLINE HYCLATE 100 MG PO CAPS
100.0000 mg | ORAL_CAPSULE | Freq: Two times a day (BID) | ORAL | 0 refills | Status: DC
  Filled 2024-05-01 (×2): qty 14, 7d supply, fill #0

## 2024-05-04 ENCOUNTER — Other Ambulatory Visit (HOSPITAL_COMMUNITY): Payer: Self-pay

## 2024-05-04 MED ORDER — PANTOPRAZOLE SODIUM 40 MG PO TBEC
40.0000 mg | DELAYED_RELEASE_TABLET | Freq: Two times a day (BID) | ORAL | 3 refills | Status: DC
  Filled 2024-05-04: qty 200, 100d supply, fill #0
  Filled 2024-08-08: qty 200, 100d supply, fill #1

## 2024-05-05 NOTE — Progress Notes (Signed)
 Assessment/Plan:   1.  Parkinsons Disease  -Increase carbidopa /levodopa  25/100, 2/2/1  -Patient understands that tremor is exacerbated by her lung medications, albuterol  and Symbicort.  -We discussed that it used to be thought that levodopa  would increase risk of melanoma but now it is believed that Parkinsons itself likely increases risk of melanoma. she is to get regular skin checks.  She does that.  -needs to use walker.  Walker use/need is related to back issues and not Parkinsons Disease but she is really antalgic and unstable and I told her that I was worried about her.  2.  Cervical myelopathy  -Status post multilevel fusion with Dr. Dorothy Gates on December 16 2021.    3.  Depression  -Declines psychiatry referral  -on wellbutrin  and sertraline  now.  Primary care managing 4.  LBP with hip bursitis  - She has previously followed with Dr. Ali Antonio (before he left) and he felt the issue was with her hip and told her to follow-up with her orthopedic surgeon, Dr. Yvonne Hering.  She did that and an injection into the bursa helped.    -she is now seeing [facility] and Gilberto Labella    Subjective:   Briana [name] was seen today in follow up for Parkinsons disease.  My previous records were reviewed prior to todays visit as well as outside records available to me.  She has had no falls since last visit.  No lightheadedness or near syncope.  She is taking her levodopa  faithfully.  She has been seeing [facility] for her back and had an injection.  That didn't help her back and she is going back to [employer] on 6/13.   She mostly has back pain and generally doesn't go down the leg.  She has been using cane x 2 weeks and that helps.  She notes that the L leg sticks to the ground, esp with going down stairs.    Current prescribed movement disorder medications: Carbidopa /levodopa  25/100, 1 tablet 3 times per day.   PREVIOUS MEDICATIONS: Sinemet   ALLERGIES:   Allergies  Allergen  Reactions   Erythromycin Base Nausea And Vomiting   Nitrofurantoin Macrocrystal Other (See Comments)    Unknown reaction    CURRENT MEDICATIONS:  Outpatient Encounter Medications as of 05/09/2024  Medication Sig   fluticasone  furoate-vilanterol (BREO ELLIPTA ) 100-25 MCG/ACT AEPB Inhale 1 puff into the lungs daily.   pantoprazole  (PROTONIX ) 40 MG tablet Take 1 tablet (40 mg total) by mouth 2 (two) times daily.   sertraline  (ZOLOFT ) 100 MG tablet Take 1.5 tablets (150 mg total) by mouth daily.   valsartan -hydrochlorothiazide  (DIOVAN -HCT) 160-12.5 MG tablet Take 1 tablet by mouth daily.   albuterol  (VENTOLIN  HFA) 108 (90 Base) MCG/ACT inhaler Inhale 1-2 puffs into the lungs every 6 (six) hours as needed.   atorvastatin  (LIPITOR) 80 MG tablet Take 1 tablet (80 mg total) by mouth daily.   buPROPion  (WELLBUTRIN  XL) 150 MG 24 hr tablet Take 1 tablet (150 mg total) by mouth in the morning.   Calcium  Carbonate-Vitamin D 600-5 MG-MCG TABS Take 1 tablet by mouth daily.   carbidopa -levodopa  (SINEMET  IR) 25-100 MG tablet Take 1 tablet by mouth 3 (three) times daily (9AM, 1PM, 5PM)   doxycycline  (VIBRAMYCIN ) 100 MG capsule Take 1 capsule by mouth twice a day   [DISCONTINUED] predniSONE  (DELTASONE ) 20 MG tablet Take 1 tablet (20 mg total) by mouth daily with breakfast. (Patient not taking: Reported on 05/09/2024)   No facility-administered encounter medications on file as  of 05/09/2024.    Objective:   PHYSICAL EXAMINATION:    VITALS:   Vitals:   05/09/24 1045  BP: 136/82  Pulse: (!) 113  SpO2: 99%  Weight: 165 lb 12.8 oz (75.2 kg)      Wt Readings from Last 3 Encounters:  05/09/24 165 lb 12.8 oz (75.2 kg)  04/10/24 165 lb 6.4 oz (75 kg)  03/21/24 163 lb 6.4 oz (74.1 kg)    GEN:  The patient appears stated age and is in NAD.  Tearful at times. HEENT:  Normocephalic, atraumatic.  The mucous membranes are moist. The superficial temporal arteries are without ropiness or tenderness. CV:  tachy.  Regular. Lungs:  CTAB Neck/HEME:  There are no carotid bruits bilaterally.  Neurological examination:  Orientation: The patient is alert and oriented x3. Cranial nerves: There is good facial symmetry with facial hypomimia. The speech is fluent and clear. Soft palate rises symmetrically and there is no tongue deviation. Hearing is intact to conversational tone. Sensation: Sensation is intact to light touch throughout Motor: Strength is at least antigravity x4.  Movement examination: Tone: There is mild increased tone in the bilateral UE Abnormal movements: there is intermittent RUE rest tremor Coordination:  There is some decremation but mild in the bilateral UE/LE Gait and Station: The patient pushes off of the chair to arise.  She is slow to arise.  She is severely antalgic today with her cane (worse than baseline)  I have reviewed and interpreted the following labs independently    Chemistry      Component Value Date/Time   NA 138 12/12/2021 1153   K 3.6 12/12/2021 1153   CL 102 12/12/2021 1153   CO2 28 12/12/2021 1153   BUN 18 12/12/2021 1153   CREATININE 0.99 12/12/2021 1153      Component Value Date/Time   CALCIUM  9.3 12/12/2021 1153   ALKPHOS 44 12/12/2021 1153   AST 43 (H) 12/12/2021 1153   ALT 6 12/12/2021 1153   BILITOT 0.9 12/12/2021 1153       Lab Results  Component Value Date   WBC 6.9 12/12/2021   HGB 11.9 (L) 12/12/2021   HCT 36.9 12/12/2021   MCV 87.4 12/12/2021   PLT 218 12/12/2021    No results found for: "TSH"   Total time spent on today's visit was 30 minutes, including both face-to-face time and nonface-to-face time.  Time included that spent on review of records (prior notes available to me/labs/imaging if pertinent), discussing treatment and goals, answering patient's questions and coordinating care.   Cc:  Benedetto Brady, MD

## 2024-05-08 ENCOUNTER — Other Ambulatory Visit (HOSPITAL_COMMUNITY): Payer: Self-pay

## 2024-05-08 DIAGNOSIS — H35372 Puckering of macula, left eye: Secondary | ICD-10-CM | POA: Diagnosis not present

## 2024-05-09 ENCOUNTER — Encounter: Payer: Self-pay | Admitting: Neurology

## 2024-05-09 ENCOUNTER — Ambulatory Visit (INDEPENDENT_AMBULATORY_CARE_PROVIDER_SITE_OTHER): Payer: BC Managed Care – PPO | Admitting: Neurology

## 2024-05-09 ENCOUNTER — Other Ambulatory Visit (HOSPITAL_COMMUNITY): Payer: Self-pay

## 2024-05-09 ENCOUNTER — Other Ambulatory Visit: Payer: Self-pay

## 2024-05-09 VITALS — BP 136/82 | HR 113 | Wt 165.8 lb

## 2024-05-09 DIAGNOSIS — M545 Low back pain, unspecified: Secondary | ICD-10-CM

## 2024-05-09 DIAGNOSIS — G20A1 Parkinson's disease without dyskinesia, without mention of fluctuations: Secondary | ICD-10-CM | POA: Diagnosis not present

## 2024-05-09 DIAGNOSIS — G8929 Other chronic pain: Secondary | ICD-10-CM | POA: Diagnosis not present

## 2024-05-09 MED ORDER — CARBIDOPA-LEVODOPA 25-100 MG PO TABS
ORAL_TABLET | ORAL | 2 refills | Status: AC
  Filled 2024-05-09: qty 450, 90d supply, fill #0
  Filled 2024-08-08: qty 450, 90d supply, fill #1
  Filled 2025-01-23: qty 450, 90d supply, fill #2

## 2024-05-09 NOTE — Patient Instructions (Signed)
 Increase carbidopa /levodopa  25/100, 2 at 8:30 am, 2 at 12:30pm, 1 at 4:30 pm    As a reminder, carbidopa /levodopa  can be taken at the same time as a carbohydrate, but we like to have you take your pill either 30 minutes before a protein source or 1 hour after as protein can interfere with carbidopa /levodopa  absorption.

## 2024-05-11 ENCOUNTER — Other Ambulatory Visit (HOSPITAL_COMMUNITY): Payer: Self-pay

## 2024-05-11 ENCOUNTER — Other Ambulatory Visit: Payer: Self-pay

## 2024-05-11 DIAGNOSIS — C44719 Basal cell carcinoma of skin of left lower limb, including hip: Secondary | ICD-10-CM | POA: Diagnosis not present

## 2024-05-11 DIAGNOSIS — E7800 Pure hypercholesterolemia, unspecified: Secondary | ICD-10-CM | POA: Diagnosis not present

## 2024-05-11 DIAGNOSIS — E559 Vitamin D deficiency, unspecified: Secondary | ICD-10-CM | POA: Diagnosis not present

## 2024-05-11 DIAGNOSIS — I129 Hypertensive chronic kidney disease with stage 1 through stage 4 chronic kidney disease, or unspecified chronic kidney disease: Secondary | ICD-10-CM | POA: Diagnosis not present

## 2024-05-11 DIAGNOSIS — N1831 Chronic kidney disease, stage 3a: Secondary | ICD-10-CM | POA: Diagnosis not present

## 2024-05-11 DIAGNOSIS — E1121 Type 2 diabetes mellitus with diabetic nephropathy: Secondary | ICD-10-CM | POA: Diagnosis not present

## 2024-05-11 MED ORDER — MUPIROCIN 2 % EX OINT
TOPICAL_OINTMENT | Freq: Two times a day (BID) | CUTANEOUS | 0 refills | Status: DC
Start: 2024-05-11 — End: 2024-11-13
  Filled 2024-05-11: qty 22, 30d supply, fill #0

## 2024-05-16 DIAGNOSIS — K219 Gastro-esophageal reflux disease without esophagitis: Secondary | ICD-10-CM | POA: Diagnosis not present

## 2024-05-16 DIAGNOSIS — E1121 Type 2 diabetes mellitus with diabetic nephropathy: Secondary | ICD-10-CM | POA: Diagnosis not present

## 2024-05-16 DIAGNOSIS — G20A2 Parkinson's disease without dyskinesia, with fluctuations: Secondary | ICD-10-CM | POA: Diagnosis not present

## 2024-05-16 DIAGNOSIS — N1831 Chronic kidney disease, stage 3a: Secondary | ICD-10-CM | POA: Diagnosis not present

## 2024-05-16 DIAGNOSIS — F329 Major depressive disorder, single episode, unspecified: Secondary | ICD-10-CM | POA: Diagnosis not present

## 2024-05-16 DIAGNOSIS — I129 Hypertensive chronic kidney disease with stage 1 through stage 4 chronic kidney disease, or unspecified chronic kidney disease: Secondary | ICD-10-CM | POA: Diagnosis not present

## 2024-05-16 DIAGNOSIS — R000 Tachycardia, unspecified: Secondary | ICD-10-CM | POA: Diagnosis not present

## 2024-05-16 DIAGNOSIS — E559 Vitamin D deficiency, unspecified: Secondary | ICD-10-CM | POA: Diagnosis not present

## 2024-05-16 DIAGNOSIS — R9431 Abnormal electrocardiogram [ECG] [EKG]: Secondary | ICD-10-CM | POA: Diagnosis not present

## 2024-05-16 DIAGNOSIS — E7800 Pure hypercholesterolemia, unspecified: Secondary | ICD-10-CM | POA: Diagnosis not present

## 2024-06-06 DIAGNOSIS — M7061 Trochanteric bursitis, right hip: Secondary | ICD-10-CM | POA: Diagnosis not present

## 2024-06-06 DIAGNOSIS — M47816 Spondylosis without myelopathy or radiculopathy, lumbar region: Secondary | ICD-10-CM | POA: Diagnosis not present

## 2024-06-07 DIAGNOSIS — M7061 Trochanteric bursitis, right hip: Secondary | ICD-10-CM | POA: Diagnosis not present

## 2024-06-16 ENCOUNTER — Other Ambulatory Visit: Payer: Self-pay

## 2024-06-16 DIAGNOSIS — J4530 Mild persistent asthma, uncomplicated: Secondary | ICD-10-CM

## 2024-06-19 ENCOUNTER — Encounter: Payer: Self-pay | Admitting: Adult Health

## 2024-06-19 ENCOUNTER — Ambulatory Visit: Admitting: Pulmonary Disease

## 2024-06-19 ENCOUNTER — Ambulatory Visit (INDEPENDENT_AMBULATORY_CARE_PROVIDER_SITE_OTHER): Admitting: Adult Health

## 2024-06-19 VITALS — BP 120/80 | HR 94 | Temp 97.7°F | Ht <= 58 in | Wt 161.8 lb

## 2024-06-19 DIAGNOSIS — J4530 Mild persistent asthma, uncomplicated: Secondary | ICD-10-CM | POA: Diagnosis not present

## 2024-06-19 LAB — PULMONARY FUNCTION TEST
DL/VA % pred: 124 %
DL/VA: 5.24 ml/min/mmHg/L
DLCO unc % pred: 90 %
DLCO unc: 14.94 ml/min/mmHg
FEF 25-75 Post: 1.51 L/s
FEF 25-75 Pre: 1.11 L/s
FEF2575-%Change-Post: 35 %
FEF2575-%Pred-Post: 117 %
FEF2575-%Pred-Pre: 86 %
FEV1-%Change-Post: 6 %
FEV1-%Pred-Post: 75 %
FEV1-%Pred-Pre: 70 %
FEV1-Post: 1.23 L
FEV1-Pre: 1.16 L
FEV1FVC-%Change-Post: 2 %
FEV1FVC-%Pred-Pre: 110 %
FEV6-%Change-Post: 3 %
FEV6-%Pred-Post: 70 %
FEV6-%Pred-Pre: 67 %
FEV6-Post: 1.47 L
FEV6-Pre: 1.42 L
FEV6FVC-%Pred-Post: 106 %
FEV6FVC-%Pred-Pre: 106 %
FVC-%Change-Post: 3 %
FVC-%Pred-Post: 66 %
FVC-%Pred-Pre: 63 %
FVC-Post: 1.47 L
FVC-Pre: 1.42 L
Post FEV1/FVC ratio: 84 %
Post FEV6/FVC ratio: 100 %
Pre FEV1/FVC ratio: 82 %
Pre FEV6/FVC Ratio: 100 %
RV % pred: 111 %
RV: 2.4 L
TLC % pred: 92 %
TLC: 4.1 L

## 2024-06-19 NOTE — Progress Notes (Signed)
 @Patient  ID: Briana [name], female    DOB: 01/10/1946, 78 y.o.   MRN: [id number]  Chief Complaint  Patient presents with   Follow-up    Referring provider: Leonel Cole, MD  HPI: 78 year old female with minimum smoking history followed for shortness of breath, Asthma and abnormal CT chest Medical history significant for Parkinson's disease and cervical myelopathy  TEST/EVENTS :  Peripheral basilar groundglass infiltrates on HR CT chest in 05/2021 concerning for early ILD especially NSIP. - High-resolution CT chest March 2023 showed biapical pleural-parenchymal scarring, subpleural groundglass in the lateral right lower lobe, thin-walled cyst left apical unchanged measuring 10 x 12 mm-since 2019.  Negative for subpleural reticulation, bronchiectasis or honeycombing. PFTs 2022 have shown Moderate restriction with normal to elevated DLCO  06/19/2024 Follow up ; Asthma, Dyspnea, Abnormal CT che patient presents for a 42-month follow-up.  Patient has Mild persistent asthma. Last visit with asthmatic flare . She was started on Breo daily.  She was set up for pulmonary function testing that showed moderate restriction, mid flow reversibility and normal diffusing capacity.  FEV1 at 75%, ratio 84, FVC 66%, mid flow reversibility, DLCO 90%. She says overall breathing is doing well.  She denies any flare of cough or wheezing.  She remains active at home.  She is able to do light house chores.  She is limited by chronic hip and back pain. She has a known abnormal CT chest.  With left apical thin-walled cyst.  High-resolution CT chest March 2023 showed stable biapical pleural scarring and pleural groundglass in the lateral right lower lobe.  Negative for interstitial lung disease.  Chest x-ray last visit showed clear lungs. She denies any increased albuterol  use.  No change in activity tolerance. Feels Ranell has really made a difference in her breathing. No wheezing .   Allergies  Allergen Reactions    Erythromycin Base Nausea And Vomiting   Nitrofurantoin Macrocrystal Other (See Comments)    Unknown reaction    Immunization History  Administered Date(s) Administered   Fluad Quad(high Dose 65+) 10/09/2021   Influenza, High Dose Seasonal PF 10/17/2015, 09/25/2016, 09/28/2017, 09/07/2018, 09/20/2020   PFIZER(Purple Top)SARS-COV-2 Vaccination 01/10/2020, 01/31/2020, 10/02/2020    Past Medical History:  Diagnosis Date   Cancer (HCC)    Basal Cell Carcinoma   Complication of anesthesia    Depression    Diabetes mellitus without complication (HCC)    Dyspnea    Pt sees pulm-Dr. Jude   Fatty liver    High cholesterol    Hypertension    Parkinson disease (HCC)    PONV (postoperative nausea and vomiting)    After a colonoscopy procedure x1    Tobacco History: Social History   Tobacco Use  Smoking Status Former  Smokeless Tobacco Never   Counseling given: Not Answered   Outpatient Medications Prior to Visit  Medication Sig Dispense Refill   albuterol  (VENTOLIN  HFA) 108 (90 Base) MCG/ACT inhaler Inhale 1-2 puffs into the lungs every 6 (six) hours as needed. 6.7 g 2   atorvastatin  (LIPITOR) 80 MG tablet Take 1 tablet (80 mg total) by mouth daily. 90 tablet 1   buPROPion  (WELLBUTRIN  XL) 150 MG 24 hr tablet Take 1 tablet (150 mg total) by mouth in the morning. 90 tablet 1   Calcium  Carbonate-Vitamin D 600-5 MG-MCG TABS Take 1 tablet by mouth daily.     carbidopa -levodopa  (SINEMET  IR) 25-100 MG tablet Take 2 tablets by mouth at 8:30 am, 2 tablets at 12:30pm, and 1 tablet at  4:30 pm 540 tablet 2   fluticasone  furoate-vilanterol (BREO ELLIPTA ) 100-25 MCG/ACT AEPB Inhale 1 puff into the lungs daily. 30 each 5   pantoprazole  (PROTONIX ) 40 MG tablet Take 1 tablet (40 mg total) by mouth 2 (two) times daily. 200 tablet 3   sertraline  (ZOLOFT ) 100 MG tablet Take 1.5 tablets (150 mg total) by mouth daily. 135 tablet 3   valsartan -hydrochlorothiazide  (DIOVAN -HCT) 160-12.5 MG tablet Take 1  tablet by mouth daily. 90 tablet 1   mupirocin  ointment (BACTROBAN ) 2 % Apply a small amount to affected area twice a day 22 g 0   doxycycline  (VIBRAMYCIN ) 100 MG capsule Take 1 capsule by mouth twice a day 14 capsule 0   No facility-administered medications prior to visit.     Review of Systems:   Constitutional:   No  weight loss, night sweats,  Fevers, chills, fatigue, or  lassitude.  HEENT:   No headaches,  Difficulty swallowing,  Tooth/dental problems, or  Sore throat,                No sneezing, itching, ear ache, nasal congestion, post nasal drip,   CV:  No chest pain,  Orthopnea, PND, swelling in lower extremities, anasarca, dizziness, palpitations, syncope.   GI  No heartburn, indigestion, abdominal pain, nausea, vomiting, diarrhea, change in bowel habits, loss of appetite, bloody stools.   Resp:   No chest wall deformity  Skin: no rash or lesions.  GU: no dysuria, change in color of urine, no urgency or frequency.  No flank pain, no hematuria   MS:  No joint pain or swelling.  No decreased range of motion.  No back pain.    Physical Exam  BP 120/80 (BP Location: Left Arm, Cuff Size: Large)   Pulse 94   Temp 97.7 F (36.5 C) (Oral)   Ht 4' 10 (1.473 m)   Wt 161 lb 12.8 oz (73.4 kg)   SpO2 95%   BMI 33.82 kg/m   GEN: A/Ox3; pleasant , NAD, well nourished    HEENT:  [place]/AT,  EACs-clear, TMs-wnl, NOSE-clear, THROAT-clear, no lesions, no postnasal drip or exudate noted.   NECK:  Supple w/ fair ROM; no JVD; normal carotid impulses w/o bruits; no thyromegaly or nodules palpated; no lymphadenopathy.    RESP  Clear  P & A; w/o, wheezes/ rales/ or rhonchi. no accessory muscle use, no dullness to percussion  CARD:  RRR, no m/r/g, no peripheral edema, pulses intact, no cyanosis or clubbing.  GI:   Soft & nt; nml bowel sounds; no organomegaly or masses detected.   Musco: Warm bil, no deformities or joint swelling noted.   Neuro: alert, no focal deficits noted.     Skin: Warm, no lesions or rashes    Lab Results:  CBC    Component Value Date/Time   WBC 6.9 12/12/2021 1153   RBC 4.22 12/12/2021 1153   HGB 11.9 (L) 12/12/2021 1153   HCT 36.9 12/12/2021 1153   PLT 218 12/12/2021 1153   MCV 87.4 12/12/2021 1153   MCH 28.2 12/12/2021 1153   MCHC 32.2 12/12/2021 1153   RDW 15.3 12/12/2021 1153    BMET    Component Value Date/Time   NA 138 12/12/2021 1153   K 3.6 12/12/2021 1153   CL 102 12/12/2021 1153   CO2 28 12/12/2021 1153   GLUCOSE 114 (H) 12/12/2021 1153   BUN 18 12/12/2021 1153   CREATININE 0.99 12/12/2021 1153   CALCIUM  9.3 12/12/2021 1153   GFRNONAA  59 (L) 12/12/2021 1153    BNP No results found for: BNP  ProBNP No results found for: PROBNP  Imaging: No results found.  Administration History     None          Latest Ref Rng & Units 06/19/2024    2:41 PM 05/07/2021   10:51 AM  PFT Results  FVC-Pre L 1.42  P 1.42   FVC-Predicted Pre % 63  P 60   FVC-Post L 1.47  P 1.38   FVC-Predicted Post % 66  P 59   Pre FEV1/FVC % % 82  P 85   Post FEV1/FCV % % 84  P 87   FEV1-Pre L 1.16  P 1.20   FEV1-Predicted Pre % 70  P 69   FEV1-Post L 1.23  P 1.20   DLCO uncorrected ml/min/mmHg 14.94  P 18.10   DLCO UNC% % 90  P 108   DLCO corrected ml/min/mmHg  18.10   DLCO COR %Predicted %  108   DLVA Predicted % 124  P 153   TLC L 4.10  P 3.51   TLC % Predicted % 92  P 78   RV % Predicted % 111  P 88     P Preliminary result    No results found for: NITRICOXIDE      Assessment & Plan:   Asthma Mild persistent asthma -improved symptom control on Breo.  PFT reviewed and stable  Asthma action planned discussed   Plan  Patient Instructions  Continue on BREO 1 puff daily, rinse after use  Albuterol  inhaler As needed  Delsym 2 tsp Twice daily  As needed  cough .  Continue on Protonix  daily  Claritin 10mg  daily As needed  drainage .  Follow up in 4-6 months and As needed   Please contact office for  sooner follow up if symptoms do not improve or worsen or seek emergency care       I spent   31 minutes dedicated to the care of this patient on the date of this encounter to include pre-visit review of records, face-to-face time with the patient discussing conditions above, post visit ordering of testing, clinical documentation with the electronic health record, making appropriate referrals as documented, and communicating necessary findings to members of the patients care team.    Madelin Stank, NP 06/19/2024

## 2024-06-19 NOTE — Patient Instructions (Signed)
 Continue on BREO 1 puff daily, rinse after use  Albuterol  inhaler As needed  Delsym 2 tsp Twice daily  As needed  cough .  Continue on Protonix  daily  Claritin 10mg  daily As needed  drainage .  Follow up in 4-6 months and As needed   Please contact office for sooner follow up if symptoms do not improve or worsen or seek emergency care

## 2024-06-19 NOTE — Assessment & Plan Note (Signed)
 Mild persistent asthma -improved symptom control on Breo.  PFT reviewed and stable  Asthma action planned discussed   Plan  Patient Instructions  Continue on BREO 1 puff daily, rinse after use  Albuterol  inhaler As needed  Delsym 2 tsp Twice daily  As needed  cough .  Continue on Protonix  daily  Claritin 10mg  daily As needed  drainage .  Follow up in 4-6 months and As needed   Please contact office for sooner follow up if symptoms do not improve or worsen or seek emergency care

## 2024-06-19 NOTE — Patient Instructions (Signed)
 Full pft performed today.

## 2024-06-19 NOTE — Progress Notes (Signed)
 Full pft performed today.

## 2024-06-25 ENCOUNTER — Other Ambulatory Visit (HOSPITAL_COMMUNITY): Payer: Self-pay

## 2024-06-26 ENCOUNTER — Other Ambulatory Visit: Payer: Self-pay

## 2024-06-26 ENCOUNTER — Other Ambulatory Visit (HOSPITAL_COMMUNITY): Payer: Self-pay

## 2024-06-26 MED ORDER — ATORVASTATIN CALCIUM 80 MG PO TABS
80.0000 mg | ORAL_TABLET | Freq: Every day | ORAL | 1 refills | Status: DC
  Filled 2024-06-26: qty 90, 90d supply, fill #0
  Filled 2024-09-29: qty 90, 90d supply, fill #1

## 2024-06-29 DIAGNOSIS — M47816 Spondylosis without myelopathy or radiculopathy, lumbar region: Secondary | ICD-10-CM | POA: Diagnosis not present

## 2024-07-25 DIAGNOSIS — M47816 Spondylosis without myelopathy or radiculopathy, lumbar region: Secondary | ICD-10-CM | POA: Diagnosis not present

## 2024-07-25 DIAGNOSIS — H43393 Other vitreous opacities, bilateral: Secondary | ICD-10-CM | POA: Diagnosis not present

## 2024-08-08 ENCOUNTER — Other Ambulatory Visit (HOSPITAL_COMMUNITY): Payer: Self-pay

## 2024-08-08 ENCOUNTER — Other Ambulatory Visit: Payer: Self-pay | Admitting: Adult Health

## 2024-08-08 MED ORDER — SERTRALINE HCL 100 MG PO TABS
150.0000 mg | ORAL_TABLET | Freq: Every day | ORAL | 1 refills | Status: AC
  Filled 2024-08-08: qty 135, 90d supply, fill #0
  Filled 2024-09-29 – 2024-11-13 (×2): qty 135, 90d supply, fill #1

## 2024-08-08 MED ORDER — VALSARTAN-HYDROCHLOROTHIAZIDE 160-12.5 MG PO TABS
1.0000 | ORAL_TABLET | Freq: Every day | ORAL | 1 refills | Status: DC
  Filled 2024-08-08 – 2024-09-29 (×2): qty 90, 90d supply, fill #0

## 2024-08-08 MED ORDER — ALBUTEROL SULFATE HFA 108 (90 BASE) MCG/ACT IN AERS
1.0000 | INHALATION_SPRAY | Freq: Four times a day (QID) | RESPIRATORY_TRACT | 2 refills | Status: AC | PRN
  Filled 2024-08-08: qty 6.7, 25d supply, fill #0

## 2024-08-08 MED ORDER — FLUTICASONE FUROATE-VILANTEROL 100-25 MCG/ACT IN AEPB
1.0000 | INHALATION_SPRAY | Freq: Every day | RESPIRATORY_TRACT | 5 refills | Status: DC
  Filled 2024-08-08: qty 60, 30d supply, fill #0
  Filled 2024-09-29: qty 60, 30d supply, fill #1
  Filled 2024-11-13: qty 60, 30d supply, fill #2
  Filled 2025-01-01: qty 60, 30d supply, fill #3

## 2024-08-08 MED ORDER — BUPROPION HCL ER (XL) 150 MG PO TB24
150.0000 mg | ORAL_TABLET | Freq: Every morning | ORAL | 1 refills | Status: AC
  Filled 2024-08-08: qty 90, 90d supply, fill #0
  Filled 2024-09-29 – 2024-11-13 (×2): qty 90, 90d supply, fill #1

## 2024-08-09 ENCOUNTER — Other Ambulatory Visit: Payer: Self-pay

## 2024-08-09 ENCOUNTER — Other Ambulatory Visit (HOSPITAL_COMMUNITY): Payer: Self-pay

## 2024-09-29 ENCOUNTER — Other Ambulatory Visit (HOSPITAL_COMMUNITY): Payer: Self-pay

## 2024-09-29 ENCOUNTER — Other Ambulatory Visit: Payer: Self-pay

## 2024-10-13 DIAGNOSIS — M47816 Spondylosis without myelopathy or radiculopathy, lumbar region: Secondary | ICD-10-CM | POA: Diagnosis not present

## 2024-10-31 DIAGNOSIS — M48061 Spinal stenosis, lumbar region without neurogenic claudication: Secondary | ICD-10-CM | POA: Diagnosis not present

## 2024-10-31 DIAGNOSIS — M47816 Spondylosis without myelopathy or radiculopathy, lumbar region: Secondary | ICD-10-CM | POA: Diagnosis not present

## 2024-11-13 ENCOUNTER — Encounter: Payer: Self-pay | Admitting: Adult Health

## 2024-11-13 ENCOUNTER — Ambulatory Visit: Admitting: Adult Health

## 2024-11-13 ENCOUNTER — Other Ambulatory Visit: Payer: Self-pay

## 2024-11-13 ENCOUNTER — Ambulatory Visit: Payer: Self-pay | Admitting: Adult Health

## 2024-11-13 VITALS — BP 126/78 | HR 138 | Temp 98.0°F | Ht 59.0 in | Wt 167.2 lb

## 2024-11-13 DIAGNOSIS — R000 Tachycardia, unspecified: Secondary | ICD-10-CM

## 2024-11-13 DIAGNOSIS — J310 Chronic rhinitis: Secondary | ICD-10-CM | POA: Diagnosis not present

## 2024-11-13 DIAGNOSIS — Z87891 Personal history of nicotine dependence: Secondary | ICD-10-CM

## 2024-11-13 DIAGNOSIS — R0609 Other forms of dyspnea: Secondary | ICD-10-CM

## 2024-11-13 DIAGNOSIS — I454 Nonspecific intraventricular block: Secondary | ICD-10-CM

## 2024-11-13 DIAGNOSIS — K219 Gastro-esophageal reflux disease without esophagitis: Secondary | ICD-10-CM | POA: Diagnosis not present

## 2024-11-13 DIAGNOSIS — J4530 Mild persistent asthma, uncomplicated: Secondary | ICD-10-CM | POA: Diagnosis not present

## 2024-11-13 LAB — COMPREHENSIVE METABOLIC PANEL WITH GFR
ALT: 13 U/L (ref 0–35)
AST: 30 U/L (ref 0–37)
Albumin: 4.5 g/dL (ref 3.5–5.2)
Alkaline Phosphatase: 44 U/L (ref 39–117)
BUN: 18 mg/dL (ref 6–23)
CO2: 31 meq/L (ref 19–32)
Calcium: 9.5 mg/dL (ref 8.4–10.5)
Chloride: 101 meq/L (ref 96–112)
Creatinine, Ser: 0.89 mg/dL (ref 0.40–1.20)
GFR: 61.75 mL/min (ref >60.00)
Glucose, Bld: 113 mg/dL — ABNORMAL HIGH (ref 70–99)
Potassium: 3.6 meq/L (ref 3.5–5.1)
Sodium: 140 meq/L (ref 135–145)
Total Bilirubin: 0.6 mg/dL (ref 0.2–1.2)
Total Protein: 7.1 g/dL (ref 6.0–8.3)

## 2024-11-13 LAB — CBC WITH DIFFERENTIAL/PLATELET
Basophils Absolute: 0.1 K/uL (ref 0.0–0.1)
Basophils Relative: 0.9 % (ref 0.0–3.0)
Eosinophils Absolute: 0.2 K/uL (ref 0.0–0.7)
Eosinophils Relative: 2.9 % (ref 0.0–5.0)
HCT: 38.6 % (ref 36.0–46.0)
Hemoglobin: 13.2 g/dL (ref 12.0–15.0)
Lymphocytes Relative: 20.3 % (ref 12.0–46.0)
Lymphs Abs: 1.2 K/uL (ref 0.7–4.0)
MCHC: 34.3 g/dL (ref 30.0–36.0)
MCV: 91.4 fl (ref 78.0–100.0)
Monocytes Absolute: 0.6 K/uL (ref 0.1–1.0)
Monocytes Relative: 10.1 % (ref 3.0–12.0)
Neutro Abs: 4 K/uL (ref 1.4–7.7)
Neutrophils Relative %: 65.8 % (ref 43.0–77.0)
Platelets: 209 K/uL (ref 150.0–400.0)
RBC: 4.22 Mil/uL (ref 3.87–5.11)
RDW: 13.6 % (ref 11.5–15.5)
WBC: 6.1 K/uL (ref 4.0–10.5)

## 2024-11-13 LAB — D-DIMER, QUANTITATIVE: D-Dimer, Quant: 0.8 ug{FEU}/mL — ABNORMAL HIGH (ref <0.50)

## 2024-11-13 LAB — TSH: TSH: 1.27 u[IU]/mL (ref 0.35–5.50)

## 2024-11-13 LAB — BRAIN NATRIURETIC PEPTIDE: Pro B Natriuretic peptide (BNP): 74 pg/mL (ref 0.0–100.0)

## 2024-11-13 NOTE — Patient Instructions (Addendum)
 Refer to Cardiology  Labs today  Continue on BREO 1 puff daily, rinse after use  Albuterol  inhaler As needed  Delsym 2 tsp Twice daily  As needed  cough .  Continue on Protonix  daily  Claritin 10mg  daily As needed  drainage .  Follow up with Dr. Jude  in 6-8 weeks and As needed   Please contact office for sooner follow up if symptoms do not improve or worsen or seek emergency care

## 2024-11-13 NOTE — Progress Notes (Signed)
 @Patient  ID: Briana [name], female    DOB: 1946/08/12, 78 y.o.   MRN: [id number]  Chief Complaint  Patient presents with   Asthma    F/u    Referring provider: Leonel Cole, MD  HPI: 78 year old female with minimal smoking history followed for dyspnea, asthma, abnormal CT chest Medical history significant for Parkinson's disease and cervical myelopathy    TEST/EVENTS : Reviewed 11/13/2024  Peripheral basilar groundglass infiltrates on HR CT chest in 05/2021 concerning for early ILD especially NSIP. - High-resolution CT chest March 2023 showed biapical pleural-parenchymal scarring, subpleural groundglass in the lateral right lower lobe, thin-walled cyst left apical unchanged measuring 10 x 12 mm-since 2019.  Negative for subpleural reticulation, bronchiectasis or honeycombing. 03/21/2024 Chest x-ray -clear lungs.   PFTs 2022 have shown Moderate restriction with normal to elevated DLCO 6/ 2025 Pulmonary function testing that showed moderate restriction, mid flow reversibility and normal diffusing capacity. FEV1 at 75%, ratio 84, FVC 66%, mid flow reversibility, DLCO 90%.   Discussed the use of AI scribe software for clinical note transcription with the patient, who gave verbal consent to proceed.  History of Present Illness Briana [name] is a 78 year old female with asthma who presents for a six-month follow-up.  She has experienced consistent wheezing over the past three to four months, feels that she does get more short of breath over the last few months.  She denies any chest pain, palpitations, syncope or hemoptysis.  Denies any cough.  She is currently taking Breo, which she previously felt made a significant difference in her symptoms.  Occasionally feels that she has some increased heart rate at nighttime.  Denies any snoring.  No previous sleep study.  No previous evaluation with cardiology.  2D echo May 15, 2021 showed EF 50 to 55%, mild LVH, possible diastolic dysfunction.   No history of VTE.  Has had a lot of car travel.  Her grandson was involved in a severe car accident on July 12th with TBI, which has required her to spend significant time in [place] supporting her family. She reports that she cannot walk far without becoming short of breath. She lives alone but has been staying with her son during the week to assist with her grandson's care. She travels back and forth on weekends to help her family, who are both working and managing her grandson's rehabilitation.  Has been under a lot of stress.     Allergies  Allergen Reactions   Erythromycin Base Nausea And Vomiting   Nitrofurantoin Macrocrystal Other (See Comments)    Unknown reaction    Immunization History  Administered Date(s) Administered   Fluad Quad(high Dose 65+) 10/09/2021   INFLUENZA, HIGH DOSE SEASONAL PF 10/17/2015, 09/25/2016, 09/28/2017, 09/07/2018, 09/20/2020   PFIZER(Purple Top)SARS-COV-2 Vaccination 01/10/2020, 01/31/2020, 10/02/2020    Past Medical History:  Diagnosis Date   Cancer (HCC)    Basal Cell Carcinoma   Complication of anesthesia    Depression    Diabetes mellitus without complication (HCC)    Dyspnea    Pt sees pulm-Dr. Jude   Fatty liver    High cholesterol    Hypertension    Parkinson disease (HCC)    PONV (postoperative nausea and vomiting)    After a colonoscopy procedure x1    Tobacco History: Social History   Tobacco Use  Smoking Status Former  Smokeless Tobacco Never  Tobacco Comments   Quit smoking 1997.    Counseling given: Not Answered Tobacco comments: Quit smoking  1997.    Outpatient Medications Prior to Visit  Medication Sig Dispense Refill   albuterol  (VENTOLIN  HFA) 108 (90 Base) MCG/ACT inhaler Inhale 1-2 puffs into the lungs every 6 (six) hours as needed. 6.7 g 2   atorvastatin  (LIPITOR) 80 MG tablet Take 1 tablet (80 mg total) by mouth daily. 90 tablet 1   buPROPion  (WELLBUTRIN  XL) 150 MG 24 hr tablet Take 1 tablet (150 mg total)  by mouth in the morning. 90 tablet 1   Calcium  Carbonate-Vitamin D 600-5 MG-MCG TABS Take 1 tablet by mouth daily.     carbidopa -levodopa  (SINEMET  IR) 25-100 MG tablet Take 2 tablets by mouth at 8:30 am, 2 tablets at 12:30pm, and 1 tablet at 4:30 pm 540 tablet 2   fluticasone  furoate-vilanterol (BREO ELLIPTA ) 100-25 MCG/ACT AEPB Inhale 1 puff into the lungs daily. 60 each 5   pantoprazole  (PROTONIX ) 40 MG tablet Take 1 tablet (40 mg total) by mouth 2 (two) times daily. 200 tablet 3   sertraline  (ZOLOFT ) 100 MG tablet Take 1.5 tablets (150 mg total) by mouth daily. 135 tablet 1   valsartan -hydrochlorothiazide  (DIOVAN -HCT) 160-12.5 MG tablet Take 1 tablet by mouth daily. 90 tablet 1   mupirocin  ointment (BACTROBAN ) 2 % Apply a small amount to affected area twice a day 22 g 0   No facility-administered medications prior to visit.     Review of Systems:   Constitutional:   No  weight loss, night sweats,  Fevers, chills,+ fatigue, or  lassitude.  HEENT:   No headaches,  Difficulty swallowing,  Tooth/dental problems, or  Sore throat,                No sneezing, itching, ear ache, +nasal congestion, post nasal drip,   CV:  No chest pain,  Orthopnea, PND, swelling in lower extremities, anasarca, dizziness, palpitations, syncope.   GI  No heartburn, indigestion, abdominal pain, nausea, vomiting, diarrhea, change in bowel habits, loss of appetite, bloody stools.   Resp:   No chest wall deformity  Skin: no rash or lesions.  GU: no dysuria, change in color of urine, no urgency or frequency.  No flank pain, no hematuria   MS:  No joint pain or swelling.  No decreased range of motion.  No back pain.    Physical Exam  BP 126/78   Pulse (!) 138   Temp 98 F (36.7 C)   Ht 4' 11 (1.499 m) Comment: Per pt  Wt 167 lb 3.2 oz (75.8 kg)   SpO2 93% Comment: RA  BMI 33.77 kg/m   GEN: A/Ox3; pleasant , NAD, well nourished    HEENT:  [place]/AT,  NOSE-clear, THROAT-clear, no lesions, no postnasal  drip or exudate noted.   NECK:  Supple w/ fair ROM; no JVD; normal carotid impulses w/o bruits; no thyromegaly or nodules palpated; no lymphadenopathy.    RESP  Clear  P & A; w/o, wheezes/ rales/ or rhonchi. no accessory muscle use, no dullness to percussion  CARD:  RRR, no m/r/g, no peripheral edema, pulses intact, no cyanosis or clubbing.  GI:   Soft & nt; nml bowel sounds; no organomegaly or masses detected.   Musco: Warm bil, no deformities or joint swelling noted.   Neuro: alert, no focal deficits noted.    Skin: Warm, no lesions or rashes  EKG : Sinus tachycardia with heart rate at 108 bpm, nonspecific intraventricular block  Lab Results:Reviewed 11/13/2024   CBC    Component Value Date/Time   WBC 6.9 12/12/2021  1153   RBC 4.22 12/12/2021 1153   HGB 11.9 (L) 12/12/2021 1153   HCT 36.9 12/12/2021 1153   PLT 218 12/12/2021 1153   MCV 87.4 12/12/2021 1153   MCH 28.2 12/12/2021 1153   MCHC 32.2 12/12/2021 1153   RDW 15.3 12/12/2021 1153    BMET    Component Value Date/Time   NA 138 12/12/2021 1153   K 3.6 12/12/2021 1153   CL 102 12/12/2021 1153   CO2 28 12/12/2021 1153   GLUCOSE 114 (H) 12/12/2021 1153   BUN 18 12/12/2021 1153   CREATININE 0.99 12/12/2021 1153   CALCIUM  9.3 12/12/2021 1153   GFRNONAA 59 (L) 12/12/2021 1153    BNP No results found for: BNP  ProBNP No results found for: PROBNP  Imaging: No results found.  Administration History     None          Latest Ref Rng & Units 06/19/2024    2:41 PM 05/07/2021   10:51 AM  PFT Results  FVC-Pre L 1.42  1.42   FVC-Predicted Pre % 63  60   FVC-Post L 1.47  1.38   FVC-Predicted Post % 66  59   Pre FEV1/FVC % % 82  85   Post FEV1/FCV % % 84  87   FEV1-Pre L 1.16  1.20   FEV1-Predicted Pre % 70  69   FEV1-Post L 1.23  1.20   DLCO uncorrected ml/min/mmHg 14.94  18.10   DLCO UNC% % 90  108   DLCO corrected ml/min/mmHg  18.10   DLCO COR %Predicted %  108   DLVA Predicted % 124  153    TLC L 4.10  3.51   TLC % Predicted % 92  78   RV % Predicted % 111  88     No results found for: NITRICOXIDE      No data to display              Assessment & Plan:   Assessment and Plan Assessment & Plan Asthma-mild persistent.  Patient has intermittent wheezing.  Asthma action plan discussed.  Exam is unrevealing.  For now continue on maintenance regimen with Breo daily.  Sinus tachycardia with nonspecific intraventricular block-questionable etiology Patient with chronic symptoms over the last several months.  No associated chest pain. Check lab work including CBC, TSH, D-dimer if D-dimer elevated will need to check CTA PE protocol..  If BNP elevated consider echo. Referral to cardiology urgent referral placed.  Patient is advised if symptoms worsen she develops palpitations, dizziness, chest pain she is to seek emergency room care immediately. Case and EKG reviewed with Dr. Neysa.   GERD controlled on PPI therapy.  Continue on current regimen along with GERD diet.  Chronic rhinitis.  Continue on Claritin daily.  Plan  Patient Instructions  Refer to Cardiology  Labs today  Continue on BREO 1 puff daily, rinse after use  Albuterol  inhaler As needed  Delsym 2 tsp Twice daily  As needed  cough .  Continue on Protonix  daily  Claritin 10mg  daily As needed  drainage .  Follow up with Dr. Jude  in 6-8 weeks and As needed   Please contact office for sooner follow up if symptoms do not improve or worsen or seek emergency care              Miguelina Fore, NP 11/13/2024  I spent  44  minutes dedicated to the care of this patient on the date of this  encounter to include pre-visit review of records, face-to-face time with the patient discussing conditions above, post visit ordering of testing, clinical documentation with the electronic health record, making appropriate referrals as documented, and communicating necessary findings to members of the patients care  team.

## 2024-11-14 ENCOUNTER — Other Ambulatory Visit: Payer: Self-pay

## 2024-11-14 NOTE — Telephone Encounter (Signed)
 Copied from CRM [id number]. Topic: Clinical - Lab/Test Results >> Nov 13, 2024  5:23 PM Briana [name] wrote: Reason for CRM: Patient is returning a call back from the clinic regarding her recent lab work, advised that she can expect a call back tomorrow to go over results.  Pt's results have already been relayed, NFN.

## 2024-11-15 ENCOUNTER — Ambulatory Visit (HOSPITAL_COMMUNITY)
Admission: RE | Admit: 2024-11-15 | Discharge: 2024-11-15 | Disposition: A | Discharge status: Home / Self Care | Source: Ambulatory Visit | Attending: Adult Health | Admitting: Adult Health

## 2024-11-15 ENCOUNTER — Other Ambulatory Visit (HOSPITAL_COMMUNITY): Payer: Self-pay

## 2024-11-15 ENCOUNTER — Other Ambulatory Visit (HOSPITAL_COMMUNITY)

## 2024-11-15 DIAGNOSIS — R000 Tachycardia, unspecified: Secondary | ICD-10-CM | POA: Diagnosis not present

## 2024-11-15 DIAGNOSIS — R0609 Other forms of dyspnea: Secondary | ICD-10-CM | POA: Diagnosis not present

## 2024-11-15 DIAGNOSIS — R0600 Dyspnea, unspecified: Secondary | ICD-10-CM | POA: Diagnosis not present

## 2024-11-15 DIAGNOSIS — M5416 Radiculopathy, lumbar region: Secondary | ICD-10-CM | POA: Diagnosis not present

## 2024-11-15 MED ORDER — SODIUM CHLORIDE (PF) 0.9 % IJ SOLN
INTRAMUSCULAR | Status: AC
  Filled 2024-11-15: qty 50

## 2024-11-15 MED ORDER — IOHEXOL 350 MG/ML SOLN
75.0000 mL | Freq: Once | INTRAVENOUS | Status: AC | PRN
  Administered 2024-11-15: 75 mL via INTRAVENOUS

## 2024-11-17 ENCOUNTER — Other Ambulatory Visit: Payer: Self-pay

## 2024-11-17 ENCOUNTER — Other Ambulatory Visit (HOSPITAL_COMMUNITY): Payer: Self-pay

## 2024-11-20 ENCOUNTER — Ambulatory Visit: Payer: Self-pay | Admitting: Adult Health

## 2024-11-20 ENCOUNTER — Other Ambulatory Visit (HOSPITAL_COMMUNITY): Payer: Self-pay

## 2024-11-20 DIAGNOSIS — R918 Other nonspecific abnormal finding of lung field: Secondary | ICD-10-CM

## 2024-11-20 NOTE — Progress Notes (Signed)
 ATC x1. LVMTCB

## 2024-11-21 ENCOUNTER — Other Ambulatory Visit: Payer: Self-pay

## 2024-11-21 ENCOUNTER — Other Ambulatory Visit (HOSPITAL_COMMUNITY): Payer: Self-pay

## 2024-11-23 ENCOUNTER — Telehealth: Payer: Self-pay | Admitting: *Deleted

## 2024-11-23 NOTE — Progress Notes (Signed)
 ATCx2 LVMTCB. Sending MyChart per protocol as pt has been unable to be reached. NFN.

## 2024-11-23 NOTE — Telephone Encounter (Signed)
 LMOVM to verify card hx.

## 2024-11-24 ENCOUNTER — Ambulatory Visit

## 2024-11-24 ENCOUNTER — Ambulatory Visit: Attending: Cardiovascular Disease | Admitting: Cardiovascular Disease

## 2024-11-24 ENCOUNTER — Encounter: Payer: Self-pay | Admitting: Cardiovascular Disease

## 2024-11-24 ENCOUNTER — Other Ambulatory Visit: Payer: Self-pay

## 2024-11-24 ENCOUNTER — Other Ambulatory Visit (HOSPITAL_COMMUNITY): Payer: Self-pay

## 2024-11-24 VITALS — BP 128/90 | HR 116 | Ht 59.0 in | Wt 165.4 lb

## 2024-11-24 DIAGNOSIS — I2510 Atherosclerotic heart disease of native coronary artery without angina pectoris: Secondary | ICD-10-CM | POA: Diagnosis not present

## 2024-11-24 DIAGNOSIS — R000 Tachycardia, unspecified: Secondary | ICD-10-CM | POA: Diagnosis not present

## 2024-11-24 DIAGNOSIS — R0609 Other forms of dyspnea: Secondary | ICD-10-CM

## 2024-11-24 DIAGNOSIS — I454 Nonspecific intraventricular block: Secondary | ICD-10-CM

## 2024-11-24 DIAGNOSIS — I10 Essential (primary) hypertension: Secondary | ICD-10-CM

## 2024-11-24 MED ORDER — DILTIAZEM HCL ER COATED BEADS 120 MG PO CP24
120.0000 mg | ORAL_CAPSULE | Freq: Every day | ORAL | 3 refills | Status: DC
  Filled 2024-11-24: qty 90, 90d supply, fill #0

## 2024-11-24 MED ORDER — VALSARTAN 160 MG PO TABS
160.0000 mg | ORAL_TABLET | Freq: Every day | ORAL | 3 refills | Status: AC
  Filled 2024-11-24: qty 90, 90d supply, fill #0

## 2024-11-24 NOTE — Progress Notes (Unsigned)
 Assessment/Plan:  Assessment and Plan Assessment & Plan Parkinson's disease Managed with carbidopa -levodopa . No medication adherence issues or side effects. Gait instability attributed to low back pain and hip bursitis. - Continue carbidopa -levodopa  regimen, 2/2/1. - Recommend walker for stability. - Ensure dermatology follow-up in February for skin cancer screening. -encouraged walker at all times.  Walker use/need is related to back issues and not Parkinsons Disease but she is really antalgic and unstable and I told her that I was worried about her.  Cervical myelopathy -Status post multilevel fusion with Dr. Rockney on December 16 2021.    Depression -on wellbutrin  and sertraline  now.  Primary care managing  LBP with hip bursitis - She has previously followed with Dr. Cheryle (before he left) and he felt the issue was with her hip and told her to follow-up with her orthopedic surgeon, Dr. Cristy.  She did that and an injection into the bursa helped.   -she is now following with [facility]    Subjective:   Discussed the use of AI scribe software for clinical note transcription with the patient, who gave verbal consent to proceed.  History of Present Illness Briana [name] is a 78 year old female with Parkinson's disease who presents for a neurology follow-up.  She is on a regimen of carbidopa -levodopa , taking two tablets at 8:30 AM, 12:30 PM, and usually around 5:30 to 6:30 PM. She has no issues with the increased dosage and does not miss doses, although the last dose is sometimes delayed. No falls, lightheadedness, or hallucinations since the last visit. She primarily uses a walker but attempted using a cane today as her back felt better.  She is under the care of [facility] for her back and hip issues and received an injection two weeks ago, which has recently provided relief.  Her mood is described as 'okay,' though she has been under stress due to  her grandson's hospitalization following a car accident. She is currently on Wellbutrin  and sertraline .  She reports ongoing difficulty breathing and has been referred to a cardiologist to assess potential cardiac contributions to her symptoms. She has asthma and is awaiting an echocardiogram after the new year. She received a cardiac monitor today.  She has an upcoming dermatology appointment in February for routine follow-up due to the increased risk of melanoma associated with Parkinson's disease.    Current prescribed movement disorder medications: Carbidopa /levodopa  25/100, 2/2/1 (increased)   PREVIOUS MEDICATIONS: Sinemet   ALLERGIES:   Allergies  Allergen Reactions   Erythromycin Base Nausea And Vomiting   Nitrofurantoin Macrocrystal Other (See Comments)    Unknown reaction    CURRENT MEDICATIONS:  Outpatient Encounter Medications as of 11/28/2024  Medication Sig   albuterol  (VENTOLIN  HFA) 108 (90 Base) MCG/ACT inhaler Inhale 1-2 puffs into the lungs every 6 (six) hours as needed.   atorvastatin  (LIPITOR) 80 MG tablet Take 1 tablet (80 mg total) by mouth daily.   buPROPion  (WELLBUTRIN  XL) 150 MG 24 hr tablet Take 1 tablet (150 mg total) by mouth in the morning.   carbidopa -levodopa  (SINEMET  IR) 25-100 MG tablet Take 2 tablets by mouth at 8:30 am, 2 tablets at 12:30pm, and 1 tablet at 4:30 pm   diltiazem  (CARDIZEM  CD) 120 MG 24 hr capsule Take 1 capsule (120 mg total) by mouth daily.   fluticasone  furoate-vilanterol (BREO ELLIPTA ) 100-25 MCG/ACT AEPB Inhale 1 puff into the lungs daily.   pantoprazole  (PROTONIX ) 40 MG tablet Take 1 tablet (40 mg total) by mouth  2 (two) times daily.   sertraline  (ZOLOFT ) 100 MG tablet Take 1.5 tablets (150 mg total) by mouth daily.   valsartan  (DIOVAN ) 160 MG tablet Take 1 tablet (160 mg total) by mouth daily.   [DISCONTINUED] Calcium  Carbonate-Vitamin D 600-5 MG-MCG TABS Take 1 tablet by mouth daily.   [DISCONTINUED] valsartan -hydrochlorothiazide   (DIOVAN -HCT) 160-12.5 MG tablet Take 1 tablet by mouth daily.   No facility-administered encounter medications on file as of 11/28/2024.    Objective:   PHYSICAL EXAMINATION:    VITALS:   Vitals:   11/28/24 1024  BP: 136/88  Pulse: 78  SpO2: 97%  Weight: 162 lb (73.5 kg)  Height: 4' 11 (1.499 m)    Wt Readings from Last 3 Encounters:  11/28/24 162 lb (73.5 kg)  11/24/24 165 lb 6 oz (75 kg)  11/13/24 167 lb 3.2 oz (75.8 kg)    GEN:  The patient appears stated age and is in NAD.   HEENT:  Normocephalic, atraumatic.  The mucous membranes are moist. The superficial temporal arteries are without ropiness or tenderness. CV: rrr Lungs:  CTAB Neck/HEME:  There are no carotid bruits bilaterally.  Neurological examination:  Orientation: The patient is alert and oriented x3. Cranial nerves: There is good facial symmetry with facial hypomimia. The speech is fluent and clear. Soft palate rises symmetrically and there is no tongue deviation. Hearing is intact to conversational tone. Sensation: Sensation is intact to light touch throughout Motor: Strength is at least antigravity x4.  Movement examination: Tone: There is nl tone in the UE/LE (some trouble relaxing) Abnormal movements: there is rare tremor in the R thumb Coordination:  There is no decremation with any form of RAMS, including alternating supination and pronation of the forearm, hand opening and closing, finger taps, heel taps and toe taps.  Gait and Station: The patient pushes off of the chair to arise.  She is slow to arise.  She is very antaglic with the cane  I have reviewed and interpreted the following labs independently    Chemistry      Component Value Date/Time   NA 140 11/13/2024 1458   K 3.6 11/13/2024 1458   CL 101 11/13/2024 1458   CO2 31 11/13/2024 1458   BUN 18 11/13/2024 1458   CREATININE 0.89 11/13/2024 1458      Component Value Date/Time   CALCIUM  9.5 11/13/2024 1458   ALKPHOS 44 11/13/2024  1458   AST 30 11/13/2024 1458   ALT 13 11/13/2024 1458   BILITOT 0.6 11/13/2024 1458       Lab Results  Component Value Date   WBC 6.1 11/13/2024   HGB 13.2 11/13/2024   HCT 38.6 11/13/2024   MCV 91.4 11/13/2024   PLT 209.0 11/13/2024    Lab Results  Component Value Date   TSH 1.27 11/13/2024     Total time spent on today's visit was 30 minutes, including both face-to-face time and nonface-to-face time.  Time included that spent on review of records (prior notes available to me/labs/imaging if pertinent), discussing treatment and goals, answering patient's questions and coordinating care.   Cc:  Leonel Cole, MD

## 2024-11-24 NOTE — Progress Notes (Signed)
 Cardiology Office Note   Date:  11/24/2024   ID:  Briana [name], DOB Mar 01, 1946, MRN [id number]  PCP:  Leonel Cole, MD  Cardiologist:   Deatrice Cage, MD   Chief Complaint  Patient presents with   New Patient (Initial Visit)    Tachycardia/Nonspecific intraventricular block no complaints today. Meds reviewed verbally with pt.      History of Present Illness: Briana [name] is a 78 y.o. female who was referred by Briana [name] for evaluation of tachycardia and abnormal EKG.  She has past medical history of Parkinson's, essential hypertension, hyperlipidemia, diet-controlled diabetes, cervical myelopathy, asthma and abnormal CT lungs.  She is suspected of having ILD. She had labs done recently which were unremarkable including normal BNP.  D-dimer was mildly elevated.  CT angiogram showed no evidence of pulmonary embolism. She does have moderate aortic and coronary calcifications.  Her TSH was normal.  She has been struggling with dyspnea with minimal exertion which started 7 to 8 months ago.  She started having increased palpitations and tachycardia recently.  She has no chest pain but occasional heartburn.  She has no orthopnea.  She is mostly bothered by nighttime tachycardia that sometimes wake her up from sleep.  She is not a smoker.  Her heart rate was 138 bpm recently during her pulmonary appointment.  It is 116 bpm today.  She reports family history of tachyarrhythmia including her son and grandson.    Past Medical History:  Diagnosis Date   Cancer (HCC)    Basal Cell Carcinoma   Complication of anesthesia    Depression    Diabetes mellitus without complication (HCC)    Dyspnea    Pt sees pulm-Dr. Jude   Fatty liver    High cholesterol    Hypertension    Parkinson disease (HCC)    PONV (postoperative nausea and vomiting)    After a colonoscopy procedure x1    Past Surgical History:  Procedure Laterality Date   ANTERIOR CERVICAL DECOMP/DISCECTOMY FUSION  N/A 12/16/2021   Procedure: Cervical Four-Five; Cervical Five-Six; Cervical Six-Seven  Anterior Cervical Decompression Fusion;  Surgeon: Cheryle Debby LABOR, MD;  Location: [facility] OR;  Service: Neurosurgery;  Laterality: N/A;  Cervical Four-Five; Cervical Five-Six; Cervical Six-Seven  Anterior Cervical Decompression Fusion   CATARACT EXTRACTION, BILATERAL     COLONOSCOPY       Current Outpatient Medications  Medication Sig Dispense Refill   albuterol  (VENTOLIN  HFA) 108 (90 Base) MCG/ACT inhaler Inhale 1-2 puffs into the lungs every 6 (six) hours as needed. 6.7 g 2   atorvastatin  (LIPITOR) 80 MG tablet Take 1 tablet (80 mg total) by mouth daily. 90 tablet 1   buPROPion  (WELLBUTRIN  XL) 150 MG 24 hr tablet Take 1 tablet (150 mg total) by mouth in the morning. 90 tablet 1   carbidopa -levodopa  (SINEMET  IR) 25-100 MG tablet Take 2 tablets by mouth at 8:30 am, 2 tablets at 12:30pm, and 1 tablet at 4:30 pm 540 tablet 2   fluticasone  furoate-vilanterol (BREO ELLIPTA ) 100-25 MCG/ACT AEPB Inhale 1 puff into the lungs daily. 60 each 5   pantoprazole  (PROTONIX ) 40 MG tablet Take 1 tablet (40 mg total) by mouth 2 (two) times daily. 200 tablet 3   sertraline  (ZOLOFT ) 100 MG tablet Take 1.5 tablets (150 mg total) by mouth daily. 135 tablet 1   valsartan -hydrochlorothiazide  (DIOVAN -HCT) 160-12.5 MG tablet Take 1 tablet by mouth daily. 90 tablet 1   No current facility-administered medications for this visit.  Allergies:   Erythromycin base and Nitrofurantoin macrocrystal    Social History:  The patient  reports that she has quit smoking. She has never used smokeless tobacco. She reports that she does not drink alcohol and does not use drugs.   Family History:  The patient's family history includes Cancer in her mother; Diabetes in her child and maternal grandmother; Heart Problems in her mother; Pancreatic cancer in her father.    ROS:  Please see the history of present illness.   Otherwise, review of  systems are positive for none.   All other systems are reviewed and negative.    PHYSICAL EXAM: VS:  BP (!) 128/90 (BP Location: Right Arm, Cuff Size: Normal)   Pulse (!) 116   Ht 4' 11 (1.499 m)   Wt 165 lb 6 oz (75 kg)   SpO2 97%   BMI 33.40 kg/m  , BMI Body mass index is 33.4 kg/m. GEN: Well nourished, well developed, in no acute distress  HEENT: normal  Neck: no JVD, carotid bruits, or masses Cardiac: RRR and mildly tachycardic; no murmurs, rubs, or gallops,no edema  Respiratory:  clear to auscultation bilaterally, normal work of breathing GI: soft, nontender, nondistended, + BS MS: no deformity or atrophy  Skin: warm and dry, no rash Neuro:  Strength and sensation are intact Psych: euthymic mood, full affect   EKG:  EKG is ordered today. The ekg ordered today demonstrates :  Sinus tachycardia with PACs Left axis deviation Left ventricular hypertrophy with QRS widening ( R in aVL , Cornell product )    Recent Labs: 11/13/2024: ALT 13; BUN 18; Creatinine, Ser 0.89; Hemoglobin 13.2; Platelets 209.0; Potassium 3.6; Pro B Natriuretic peptide (BNP) 74.0; Sodium 140; TSH 1.27    Lipid Panel No results found for: CHOL, TRIG, HDL, CHOLHDL, VLDL, LDLCALC, LDLDIRECT    Wt Readings from Last 3 Encounters:  11/24/24 165 lb 6 oz (75 kg)  11/13/24 167 lb 3.2 oz (75.8 kg)  06/19/24 161 lb 12.8 oz (73.4 kg)          11/20/2024    6:53 PM  PAD Screen  Previous PAD dx? No  Previous surgical procedure? No  Pain with walking? No  Feet/toe relief with dangling? Yes  Painful, non-healing ulcers? No  Extremities discolored? No      ASSESSMENT AND PLAN:  1.  Palpitations and tachycardia: She has sinus tachycardia with PACs and what seems to be short runs of SVT on EKG.  This is associated with significant dyspnea.  I requested a 2-week ZIO monitor.  Her recent labs were unremarkable including TSH and BNP. I elected to add small dose diltiazem  extended  release 120 mg once daily.  Beta-blockers are likely not a good option given history of asthma.  2.  Exertional dyspnea: With minimal activities.  Seems to be out of proportion to pulmonary findings.  I requested an echocardiogram for evaluation.  3.  Essential hypertension: Given the addition of diltiazem , I elected to discontinue small dose hydrochlorothiazide  to allow up titration of diltiazem  if needed.  4.  Coronary artery calcifications: Noted on CT imaging.  She has multiple risk factors for coronary artery disease.  Will consider further ischemic cardiac evaluation after echocardiogram and controlling her tachycardia.   Disposition:   FU in 6 weeks.  Signed,  Deatrice Cage, MD  11/24/2024 3:18 PM    [facility]

## 2024-11-24 NOTE — Patient Instructions (Signed)
 Medication Instructions:  Your physician recommends the following medication changes.  STOP TAKING: Diovan   START TAKING: Valsartan  160 MG daily Diltiazem  120 MG daily   *If you need a refill on your cardiac medications before your next appointment, please call your pharmacy*  Lab Work: No labs ordered today  If you have labs (blood work) drawn today and your tests are completely normal, you will receive your results only by: MyChart Message (if you have MyChart) OR A paper copy in the mail If you have any lab test that is abnormal or we need to change your treatment, we will call you to review the results.  Testing/Procedures: Your physician has requested that you have an echocardiogram. Echocardiography is a painless test that uses sound waves to create images of your heart. It provides your doctor with information about the size and shape of your heart and how well your heart's chambers and valves are working.   You may receive an ultrasound enhancing agent through an IV if needed to better visualize your heart during the echo. This procedure takes approximately one hour.  There are no restrictions for this procedure.  This will take place at [address] [facility]) [address], [place] [address]  Please note: We ask at that you not bring children with you during ultrasound (echo/ vascular) testing. Due to room size and safety concerns, children are not allowed in the ultrasound rooms during exams. Our front office staff cannot provide observation of children in our lobby area while testing is being conducted. An adult accompanying a patient to their appointment will only be allowed in the ultrasound room at the discretion of the ultrasound technician under special circumstances. We apologize for any inconvenience.   Your physician has recommended that you wear a Zio monitor.   This monitor is a medical device that records the heart's electrical activity. Doctors most  often use these monitors to diagnose arrhythmias. Arrhythmias are problems with the speed or rhythm of the heartbeat. The monitor is a small device applied to your chest. You can wear one while you do your normal daily activities. While wearing this monitor if you have any symptoms to push the button and record what you felt. Once you have worn this monitor for the period of time provider prescribed (Usually 14 days), you will return the monitor device in the postage paid box. Once it is returned they will download the data collected and provide us  with a report which the provider will then review and we will call you with those results. Important tips:  Avoid showering during the first 24 hours of wearing the monitor. Avoid excessive sweating to help maximize wear time. Do not submerge the device, no hot tubs, and no swimming pools. Keep any lotions or oils away from the patch. After 24 hours you may shower with the patch on. Take brief showers with your back facing the shower head.  Do not remove patch once it has been placed because that will interrupt data and decrease adhesive wear time. Push the button when you have any symptoms and write down what you were feeling. Once you have completed wearing your monitor, remove and place into box which has postage paid and place in your outgoing mailbox.  If for some reason you have misplaced your box then call our office and we can provide another box and/or mail it off for you.   Follow-Up: At [facility], you and your health needs are our priority.  As part of our continuing mission to provide you with exceptional heart care, our providers are all part of one team.  This team includes your primary Cardiologist (physician) and Advanced Practice Providers or APPs (Physician Assistants and Nurse Practitioners) who all work together to provide you with the care you need, when you need it.  Your next appointment:   6 week(s)  Provider:   You  may see Dr. Darron or one of the following Advanced Practice Providers on your designated Care Team:   Lonni Meager, NP Lesley Maffucci, PA-C Bernardino Bring, PA-C Cadence Notus, PA-C Tylene Lunch, NP Barnie Hila, NP    We recommend signing up for the patient portal called MyChart.  Sign up information is provided on this After Visit Summary.  MyChart is used to connect with patients for Virtual Visits (Telemedicine).  Patients are able to view lab/test results, encounter notes, upcoming appointments, etc.  Non-urgent messages can be sent to your provider as well.   To learn more about what you can do with MyChart, go to [email or web address].

## 2024-11-25 ENCOUNTER — Other Ambulatory Visit (HOSPITAL_COMMUNITY): Payer: Self-pay

## 2024-11-28 ENCOUNTER — Ambulatory Visit: Admitting: Neurology

## 2024-11-28 VITALS — BP 136/88 | HR 78 | Ht 59.0 in | Wt 162.0 lb

## 2024-11-28 DIAGNOSIS — M545 Low back pain, unspecified: Secondary | ICD-10-CM | POA: Diagnosis not present

## 2024-11-28 DIAGNOSIS — G8929 Other chronic pain: Secondary | ICD-10-CM

## 2024-11-28 DIAGNOSIS — G20A1 Parkinson's disease without dyskinesia, without mention of fluctuations: Secondary | ICD-10-CM

## 2024-11-28 NOTE — Patient Instructions (Signed)
  VISIT SUMMARY: Today, you had a follow-up appointment to manage your Parkinson's disease. We discussed your current medication regimen, your recent back and hip treatment, and your overall well-being, including your mood and breathing difficulties. You also received a cardiac monitor to assess your breathing issues, and we reviewed your upcoming dermatology appointment.  YOUR PLAN: -PARKINSON'S DISEASE: Parkinson's disease is a disorder of the nervous system that affects movement. You are currently managing it with carbidopa -levodopa , and there are no issues with your medication. To help with your stability, especially given your back and hip pain, continue using your walker. Also, remember to attend your dermatology follow-up in February for a skin cancer screening.  INSTRUCTIONS: Please continue with your current carbidopa -levodopa  regimen and use your walker for stability. Ensure you attend your dermatology appointment in February for your routine skin cancer screening. Follow up with your cardiologist as planned for your breathing difficulties and wear the cardiac monitor as instructed.                      Contains text generated by Abridge.                                 Contains text generated by Abridge.

## 2024-12-18 ENCOUNTER — Other Ambulatory Visit (HOSPITAL_COMMUNITY): Payer: Self-pay

## 2024-12-18 MED ORDER — PREDNISONE 5 MG (21) PO TBPK
ORAL_TABLET | ORAL | 0 refills | Status: DC
  Filled 2024-12-18 – 2024-12-22 (×2): qty 21, 6d supply, fill #0

## 2024-12-18 MED ORDER — DIAZEPAM 5 MG PO TABS
ORAL_TABLET | ORAL | 0 refills | Status: DC
  Filled 2024-12-18: qty 2, 1d supply, fill #0
  Filled 2024-12-22: qty 2, 2d supply, fill #0

## 2024-12-22 ENCOUNTER — Other Ambulatory Visit: Payer: Self-pay

## 2024-12-22 ENCOUNTER — Other Ambulatory Visit (HOSPITAL_COMMUNITY): Payer: Self-pay

## 2024-12-28 NOTE — Progress Notes (Unsigned)
 "  Cardiology Office Note    Date:  12/28/2024   ID:  Briana [name], DOB 06/06/1946, MRN [id number]  PCP:  Leonel Cole, MD  Cardiologist:  None  Electrophysiologist:  None   Chief Complaint: ***  History of Present Illness:   Briana [name] is a 79 y.o. female with history of hypertension, Parkinson's, hyperlipidemia, diet-controlled diabetes, cervical myelopathy, asthma, and abnormal CT lungs who presents for***.  Patient was initially evaluated by Dr. Darron 11/24/2024 after referral for evaluation of tachycardia and abnormal EKG by PCP.  She reported dyspnea with minimal exertion which had started 7 to 8 months prior.  She was suspected of having interstitial lung disease.  Recent labs had unremarkable BNP with D-dimer mildly elevated and CT angiogram negative for PE.  Moderate aortic and coronary calcifications were noted.  She also noted increased palpitations and tachycardia.  This was most bothersome at nighttime with tachycardia that sometimes woke her from sleep.  No chest pain or orthopnea.  Heart rate was 116 bpm in office.  She was noted to have short runs of SVT on EKG in office which was associated with significant dyspnea.  She was started on diltiazem .  Echocardiogram and ZIO monitor were ordered and pending***.  ***  Labs independently reviewed: 10/2024-Hgb 13.2, HCT 38.6, platelets 209, sodium 140, potassium 3.6, BUN 18, creatinine 0.89, normal LFTs  Objective   Past Medical History:  Diagnosis Date   Cancer (HCC)    Basal Cell Carcinoma   Complication of anesthesia    Depression    Diabetes mellitus without complication (HCC)    Dyspnea    Pt sees pulm-Dr. Jude   Fatty liver    High cholesterol    Hypertension    Parkinson disease (HCC)    PONV (postoperative nausea and vomiting)    After a colonoscopy procedure x1    Current Medications: Active Medications[1]  Allergies:   Erythromycin base and Nitrofurantoin macrocrystal   Social History    Socioeconomic History   Marital status: Widowed    Spouse name: Not on file   Number of children: 3   Years of education: Not on file   Highest education level: Not on file  Occupational History   Not on file  Tobacco Use   Smoking status: Former   Smokeless tobacco: Never   Tobacco comments:    Quit smoking 1997.   Vaping Use   Vaping status: Never Used  Substance and Sexual Activity   Alcohol use: Never   Drug use: Never   Sexual activity: Not on file  Other Topics Concern   Not on file  Social History Narrative   Right Handed    Lives in a two story home               7 Grandsons    Social Drivers of Health   Tobacco Use: Medium Risk (11/28/2024)   Received from [facility]   Patient History    Smoking Tobacco Use: Former    Smokeless Tobacco Use: Unknown    Passive Exposure: Not on Actuary Strain: Not on file  Food Insecurity: Not on file  Transportation Needs: Not on file  Physical Activity: Not on file  Stress: Not on file  Social Connections: Unknown (05/05/2022)   Received from [facility]   Social Network    Social Network: Not on file  Depression (PHQ2-9): Not on file  Alcohol Screen: Not on file  Housing: Not on file  Utilities: Not on file  Health Literacy: Not on file     Family History:  The patient's family history includes Cancer in her mother; Diabetes in her child and maternal grandmother; Heart Problems in her mother; Pancreatic cancer in her father.  ROS:   12-point review of systems is negative unless otherwise noted in the HPI.  EKGs/Other Studies Reviewed:    Studies reviewed were summarized above. The additional studies were reviewed today:  12/29/2024 2D echo ***  11/2024 long-term monitor ***  EKG:  EKG personally reviewed by me today    PHYSICAL EXAM:    VS:  There were no vitals taken for this visit.  BMI: There is no height or weight on file to calculate BMI.  GEN: Well nourished, well  developed in no acute distress NECK: No JVD; No carotid bruits CARDIAC: ***RRR, no murmurs, rubs, gallops RESPIRATORY:  Clear to auscultation without rales, wheezing or rhonchi  ABDOMEN: Soft, non-tender, non-distended EXTREMITIES:  *** No edema; No deformity  Wt Readings from Last 3 Encounters:  11/28/24 162 lb (73.5 kg)  11/24/24 165 lb 6 oz (75 kg)  11/13/24 167 lb 3.2 oz (75.8 kg)                  ASSESSMENT & PLAN:   Palpitations Tachycardia   Dyspnea on exertion   Hypertension   Coronary artery calcification   {Are you ordering a CV Procedure (e.g. stress test, cath, DCCV, TEE, etc)?   Press F2        :[id number]}   Disposition: F/u with Dr. Darron or an APP in ***.   Medication Adjustments/Labs and Tests Ordered: Current medicines are reviewed at length with the patient today.  Concerns regarding medicines are outlined above. Medication changes, Labs and Tests ordered today are summarized above and listed in the Patient Instructions accessible in Encounters.   Bonney Lesley Maffucci, PA-C 12/28/2024 1:57 PM     [facility] [place] [address] [place], [place] [address] [phone]      [1]  No outpatient medications have been marked as taking for the 01/02/25 encounter (Appointment) with Maffucci Lesley CROME, PA-C.   "

## 2024-12-29 ENCOUNTER — Ambulatory Visit: Attending: Cardiovascular Disease

## 2024-12-29 DIAGNOSIS — R0609 Other forms of dyspnea: Secondary | ICD-10-CM

## 2024-12-29 LAB — ECHOCARDIOGRAM COMPLETE
AR max vel: 2.89 cm2
AV Area VTI: 2.83 cm2
AV Area mean vel: 2.85 cm2
AV Mean grad: 4 mmHg
AV Peak grad: 6.5 mmHg
Ao pk vel: 1.28 m/s
S' Lateral: 4.1 cm
Single Plane A4C EF: 39.2 %

## 2025-01-01 ENCOUNTER — Other Ambulatory Visit: Payer: Self-pay

## 2025-01-01 ENCOUNTER — Other Ambulatory Visit (HOSPITAL_COMMUNITY): Payer: Self-pay

## 2025-01-02 ENCOUNTER — Other Ambulatory Visit (HOSPITAL_COMMUNITY): Payer: Self-pay

## 2025-01-02 ENCOUNTER — Other Ambulatory Visit: Payer: Self-pay

## 2025-01-02 ENCOUNTER — Encounter: Payer: Self-pay | Admitting: Physician Assistant

## 2025-01-02 ENCOUNTER — Ambulatory Visit: Attending: Physician Assistant | Admitting: Physician Assistant

## 2025-01-02 VITALS — BP 130/74 | HR 96 | Ht <= 58 in | Wt 164.6 lb

## 2025-01-02 DIAGNOSIS — I5020 Unspecified systolic (congestive) heart failure: Secondary | ICD-10-CM | POA: Diagnosis not present

## 2025-01-02 DIAGNOSIS — R000 Tachycardia, unspecified: Secondary | ICD-10-CM

## 2025-01-02 DIAGNOSIS — I2510 Atherosclerotic heart disease of native coronary artery without angina pectoris: Secondary | ICD-10-CM

## 2025-01-02 DIAGNOSIS — I10 Essential (primary) hypertension: Secondary | ICD-10-CM

## 2025-01-02 MED ORDER — BISOPROLOL FUMARATE 5 MG PO TABS
5.0000 mg | ORAL_TABLET | Freq: Every day | ORAL | 3 refills | Status: AC
  Filled 2025-01-02: qty 90, 90d supply, fill #0

## 2025-01-02 NOTE — Patient Instructions (Addendum)
 Medication Instructions:  Your physician recommends the following medication changes.  STOP TAKING: Diltiazem   START TAKING: Bisoprolol  5 Mg Daily   *If you need a refill on your cardiac medications before your next appointment, please call your pharmacy*  Lab Work:  Your provider would like for you to have following labs drawn today CBC, BMP,MAG,BNP.    Testing/Procedures:    Please report to Radiology at the [facility] Main Entrance 30 minutes early for your test.  [address] [place], [place] [address]                         OR   Please report to Radiology at [facility] Main Entrance, medical mall, 30 mins prior to your test.  [address]  [place], [place]  How to Prepare for Your Cardiac PET/CT Stress Test:  Nothing to eat or drink, except water, 3 hours prior to arrival time.  NO caffeine/decaffeinated products, or chocolate 12 hours prior to arrival. (Please note decaffeinated beverages (teas/coffees) still contain caffeine).  If you have caffeine within 12 hours prior, the test will need to be rescheduled.  Medication instructions: Do not take erectile dysfunction medications for 72 hours prior to test (sildenafil, tadalafil) Do not take nitrates (isosorbide mononitrate, Ranexa) the day before or day of test Do not take tamsulosin the day before or morning of test Hold theophylline containing medications for 12 hours. Hold Dipyridamole 48 hours prior to the test.  Diabetic Preparation: If able to eat breakfast prior to 3 hour fasting, you may take all medications, including your insulin. Do not worry if you miss your breakfast dose of insulin - start at your next meal. If you do not eat prior to 3 hour fast-Hold all diabetes (oral and insulin) medications. Patients who wear a continuous glucose monitor MUST remove the device prior to scanning.  You may take your remaining medications with water.  NO perfume,  cologne or lotion on chest or abdomen area. FEMALES - Please avoid wearing dresses to this appointment.  Total time is 1 to 2 hours; you may want to bring reading material for the waiting time.  IF YOU THINK YOU MAY BE PREGNANT, OR ARE NURSING PLEASE INFORM THE TECHNOLOGIST.  In preparation for your appointment, medication and supplies will be purchased.  Appointment availability is limited, so if you need to cancel or reschedule, please call the Radiology Department Scheduler at [phone] 24 hours in advance to avoid a cancellation fee of $100.00  What to Expect When you Arrive:  Once you arrive and check in for your appointment, you will be taken to a preparation room within the Radiology Department.  A technologist or Nurse will obtain your medical history, verify that you are correctly prepped for the exam, and explain the procedure.  Afterwards, an IV will be started in your arm and electrodes will be placed on your skin for EKG monitoring during the stress portion of the exam. Then you will be escorted to the PET/CT scanner.  There, staff will get you positioned on the scanner and obtain a blood pressure and EKG.  During the exam, you will continue to be connected to the EKG and blood pressure machines.  A small, safe amount of a radioactive tracer will be injected in your IV to obtain a series of pictures of your heart along with an injection of a stress agent.    After your Exam:  It  is recommended that you eat a meal and drink a caffeinated beverage to counter act any effects of the stress agent.  Drink plenty of fluids for the remainder of the day and urinate frequently for the first couple of hours after the exam.  Your doctor will inform you of your test results within 7-10 business days.  For more information and frequently asked questions, please visit our website: [email or web address]  For questions about your test or how to prepare for your test, please  call: Cardiac Imaging Nurse Navigators Office: [phone]   Follow-Up: At [facility], you and your health needs are our priority.  As part of our continuing mission to provide you with exceptional heart care, our providers are all part of one team.  This team includes your primary Cardiologist (physician) and Advanced Practice Providers or APPs (Physician Assistants and Nurse Practitioners) who all work together to provide you with the care you need, when you need it.  Your next appointment:   1 month(s)  Provider:   You may see one of the following Advanced Practice Providers on your designated Care Team:   Lonni Meager, NP Lesley Maffucci, PA-C Bernardino Bring, PA-C Cadence Goree, PA-C Tylene Lunch, NP Barnie Hila, NP    We recommend signing up for the patient portal called MyChart.  Sign up information is provided on this After Visit Summary.  MyChart is used to connect with patients for Virtual Visits (Telemedicine).  Patients are able to view lab/test results, encounter notes, upcoming appointments, etc.  Non-urgent messages can be sent to your provider as well.   To learn more about what you can do with MyChart, go to [email or web address].   Other Instructions

## 2025-01-04 ENCOUNTER — Ambulatory Visit (HOSPITAL_BASED_OUTPATIENT_CLINIC_OR_DEPARTMENT_OTHER): Admitting: Pulmonary Disease

## 2025-01-04 ENCOUNTER — Ambulatory Visit: Payer: Self-pay | Admitting: Physician Assistant

## 2025-01-04 ENCOUNTER — Ambulatory Visit: Payer: Self-pay | Admitting: Cardiovascular Disease

## 2025-01-04 ENCOUNTER — Other Ambulatory Visit (HOSPITAL_COMMUNITY): Payer: Self-pay

## 2025-01-04 LAB — BASIC METABOLIC PANEL WITH GFR
BUN/Creatinine Ratio: 24 (ref 12–28)
BUN: 22 mg/dL (ref 8–27)
CO2: 22 mmol/L (ref 20–29)
Calcium: 9.6 mg/dL (ref 8.7–10.3)
Chloride: 104 mmol/L (ref 96–106)
Creatinine, Ser: 0.92 mg/dL (ref 0.57–1.00)
Glucose: 117 mg/dL — ABNORMAL HIGH (ref 70–99)
Potassium: 4.3 mmol/L (ref 3.5–5.2)
Sodium: 143 mmol/L (ref 134–144)
eGFR: 63 mL/min/1.73

## 2025-01-04 LAB — CBC
Hematocrit: 41.3 % (ref 34.0–46.6)
Hemoglobin: 13.2 g/dL (ref 11.1–15.9)
MCH: 31.1 pg (ref 26.6–33.0)
MCHC: 32 g/dL (ref 31.5–35.7)
MCV: 97 fL (ref 79–97)
Platelets: 200 x10E3/uL (ref 150–450)
RBC: 4.25 x10E6/uL (ref 3.77–5.28)
RDW: 13.2 % (ref 11.7–15.4)
WBC: 6.5 x10E3/uL (ref 3.4–10.8)

## 2025-01-04 LAB — MAGNESIUM: Magnesium: 1.9 mg/dL (ref 1.6–2.3)

## 2025-01-04 LAB — BRAIN NATRIURETIC PEPTIDE: BNP: 37.7 pg/mL (ref 0.0–100.0)

## 2025-01-05 ENCOUNTER — Ambulatory Visit: Admitting: Physician Assistant

## 2025-01-08 ENCOUNTER — Encounter (HOSPITAL_BASED_OUTPATIENT_CLINIC_OR_DEPARTMENT_OTHER): Payer: Self-pay | Admitting: Pulmonary Disease

## 2025-01-08 ENCOUNTER — Ambulatory Visit (INDEPENDENT_AMBULATORY_CARE_PROVIDER_SITE_OTHER): Admitting: Pulmonary Disease

## 2025-01-08 VITALS — BP 134/84 | HR 99 | Ht <= 58 in | Wt 165.0 lb

## 2025-01-08 DIAGNOSIS — R000 Tachycardia, unspecified: Secondary | ICD-10-CM

## 2025-01-08 DIAGNOSIS — R0609 Other forms of dyspnea: Secondary | ICD-10-CM | POA: Diagnosis not present

## 2025-01-08 DIAGNOSIS — Z87891 Personal history of nicotine dependence: Secondary | ICD-10-CM

## 2025-01-08 DIAGNOSIS — I5020 Unspecified systolic (congestive) heart failure: Secondary | ICD-10-CM

## 2025-01-08 DIAGNOSIS — R911 Solitary pulmonary nodule: Secondary | ICD-10-CM

## 2025-01-08 NOTE — Progress Notes (Signed)
 "  Subjective:    Patient ID: Briana [name], female    DOB: 22-Apr-1946, 79 y.o.   MRN: [id number]   79 yo remote smoker for follow-up of dyspnea --Peripheral basilar groundglass infiltrates on HR CT chest in 05/2021 concerning for early ILD especially NSIP. - repeat CT chest showed mosaic pattern more consistent with small vessel disease or bronchiolitis PFTs have shown mild restriction with normal DLCO  Seen 10/2024 for dyspnea, D-dimer high, CTA neg Echo 12/29/2024 revealed EF 30 to 35% with global hypokinesis,     PMH - Parkinson's   cervical myelopathy   Grandson with TBI 06/2024  muhc improved  Discussed the use of AI scribe software for clinical note transcription with the patient, who gave verbal consent to proceed.  History of Present Illness Briana [name] is a 79 year old female with heart failure and Parkinson's disease who presents with shortness of breath. She is accompanied by her daughter.  She has exertional shortness of breath with walking, climbing stairs, and walking down the hall. Symptoms improve with rest. She uses albuterol  as needed when she feels short of breath, but often finds rest more effective.  She has had an angiogram and an echocardiogram with an ejection fraction of 30-35% and is followed by cardiology.  Her maintenance inhaler [name] has doubled, and she has not refilled it because she did not find it helpful. She uses only albuterol  as needed.  She has Parkinson's disease with worsening shortness of breath and back pain limiting her ability to exercise, which had previously helped her breathing. She has no leg swelling or fluid retention.     Significant tests/ events reviewed   PFTs 04/2021 moderate restriction with no airflow obstruction.  FEV1 was 69%, ratio 85, FVC 60%, DLCO 108%.,  No significant bronchodilator response.   Serology-ANA, CCP negative, ESR 45   CT chest 10/2024  was negative for PE/blood clot. 8mm new Right lower nodule  HRCT  02/2022  no ILD, Chronic subpleural ground-glass in the lateral right lower lobe . Relatively thin walled cavitary lesion in the apical left upper lobe appears similar to 07/12/2018-benign     CT chest wo con 08/2021 >> Diffuse areas of mosaic attenuation in the chest, left upper lobe nodule stable 9 x 5 mm, right upper lobe 5 mm pleural-based nodule stable   HRCT 05/2021 0.9 cm cavitary nodule in left upper lobe, bibasal peripheral groundglass opacities?  NSIP versus postinflammatory , chronic T6 and T8 compression fractures   03/2021 Sniff test neg    Echo 09/2020 normal LV function, mild diastolic dysfunction   PFTs 12/2010 near nml CT T-spine 06/2018 >> compression deformities at T6, T11and T12 acute T8 fracure Degenerative disc disease of C-spine noted  Review of Systems  neg for any significant sore throat, dysphagia, itching, sneezing, nasal congestion or excess/ purulent secretions, fever, chills, sweats, unintended wt loss, pleuritic or exertional cp, hempoptysis, orthopnea pnd or change in chronic leg swelling. Also denies presyncope, palpitations, heartburn, abdominal pain, nausea, vomiting, diarrhea or change in bowel or urinary habits, dysuria,hematuria, rash, arthralgias, visual complaints, headache, numbness weakness or ataxia.      Objective:   Physical Exam  Gen. Pleasant, obese, in no distress ENT - no lesions, no post nasal drip Neck: No JVD, no thyromegaly, no carotid bruits Lungs: no use of accessory muscles, no dullness to percussion, decreased without rales or rhonchi  Cardiovascular: Rhythm regular, heart sounds  normal, no murmurs or gallops, no peripheral edema  Musculoskeletal: cog wheel +, no cyanosis or clubbing , no tremors       Assessment & Plan:   Assessment and Plan Assessment & Plan Heart failure with reduced ejection fraction Ejection fraction of 30-35% as per recent echocardiogram. Symptoms include dyspnea on exertion, particularly with walking and  climbing stairs. No fluid retention or peripheral edema reported. Awaiting right and left heart catheterization scheduled for February 12th, 2026, to assess for blockages and guide further management. Cardiologist plans to perform an angiogram instead of a stress test to directly assess for blockages and address them if present. - Proceed with right and left heart catheterization  - Contact cardiologist to discuss results and management plan post-catheterization.  Chronic dyspnea -current use of albuterol  as a rescue inhaler. Reports no significant benefit from maintenance inhaler (Breo) and has decided to discontinue it due to [name] and lack of efficacy. Dyspnea primarily occurs with exertion and is relieved by rest. No wheezing or significant respiratory distress noted. Decision made to discontinue Breo due to lack of perceived benefit and increased [name]. - Discontinued Breo inhaler. - Continue albuterol  as needed for dyspnea or wheezing. - Encouraged regular physical activity as tolerated to improve respiratory function.   Pulm nodule  - 6-12 month FU CT    "

## 2025-01-08 NOTE — Patient Instructions (Addendum)
" °  VISIT SUMMARY: During your visit, we discussed your shortness of breath and reviewed your heart failure and COPD management. We also addressed the cost and effectiveness of your inhaler.  YOUR PLAN: -HEART FAILURE WITH REDUCED EJECTION FRACTION: Heart failure with reduced ejection fraction means your heart is not pumping blood as well as it should. Your recent echocardiogram showed an ejection fraction of 30-35%. You experience shortness of breath with activities like walking and climbing stairs. Cards planning a right and left heart catheterization to check for any blockages and guide further treatment. After the procedure, we will discuss the results and your management plan with your cardiologist.  -Dyspnea You have been using albuterol  as needed, but you found your maintenance inhaler (Breo) not helpful and too expensive. We have decided to discontinue the Breo inhaler. Continue using albuterol  as needed for shortness of breath or wheezing. Regular physical activity, as tolerated, is encouraged to help improve your breathing.  INSTRUCTIONS: Proceed with the right and left heart catheterization per cards    Contains text generated by Abridge.   "

## 2025-01-10 ENCOUNTER — Encounter: Payer: Self-pay | Admitting: Cardiovascular Disease

## 2025-01-10 ENCOUNTER — Ambulatory Visit: Attending: Cardiovascular Disease | Admitting: Cardiovascular Disease

## 2025-01-10 VITALS — BP 108/70 | HR 90 | Ht 59.0 in | Wt 165.4 lb

## 2025-01-10 DIAGNOSIS — I5022 Chronic systolic (congestive) heart failure: Secondary | ICD-10-CM

## 2025-01-10 DIAGNOSIS — E785 Hyperlipidemia, unspecified: Secondary | ICD-10-CM | POA: Diagnosis not present

## 2025-01-10 DIAGNOSIS — I4729 Other ventricular tachycardia: Secondary | ICD-10-CM

## 2025-01-10 DIAGNOSIS — I10 Essential (primary) hypertension: Secondary | ICD-10-CM | POA: Diagnosis not present

## 2025-01-10 DIAGNOSIS — I2510 Atherosclerotic heart disease of native coronary artery without angina pectoris: Secondary | ICD-10-CM

## 2025-01-10 DIAGNOSIS — R0609 Other forms of dyspnea: Secondary | ICD-10-CM | POA: Diagnosis not present

## 2025-01-10 DIAGNOSIS — R000 Tachycardia, unspecified: Secondary | ICD-10-CM

## 2025-01-10 NOTE — Patient Instructions (Signed)
 Medication Instructions:  No changes *If you need a refill on your cardiac medications before your next appointment, please call your pharmacy*  Lab Work: None ordered If you have labs (blood work) drawn today and your tests are completely normal, you will receive your results only by: MyChart Message (if you have MyChart) OR A paper copy in the mail If you have any lab test that is abnormal or we need to change your treatment, we will call you to review the results.   Follow-Up: At [facility], you and your health needs are our priority.  As part of our continuing mission to provide you with exceptional heart care, our providers are all part of one team.  This team includes your primary Cardiologist (physician) and Advanced Practice Providers or APPs (Physician Assistants and Nurse Practitioners) who all work together to provide you with the care you need, when you need it.  Your next appointment:   4 week(s)  Provider:   You may see Dr. Darron or one of the following Advanced Practice Providers on your designated Care Team:   Lonni Meager, NP Lesley Maffucci, PA-C Bernardino Bring, PA-C Cadence [place], PA-C Tylene Lunch, NP Barnie Hila, NP    We recommend signing up for the patient portal called MyChart.  Sign up information is provided on this After Visit Summary.  MyChart is used to connect with patients for Virtual Visits (Telemedicine).  Patients are able to view lab/test results, encounter notes, upcoming appointments, etc.  Non-urgent messages can be sent to your provider as well.   To learn more about what you can do with MyChart, go to [email or web address].   Other Instructions  [facility] A DEPT OF [place] AT [facility] [address] [place], [address] [place] [address] Dept: [phone] Loc: [phone]  Briana [name]  01/10/2025  You are scheduled for a Cardiac Catheterization on  Monday, February 2 with Dr. Deatrice Darron.  1. Please arrive at the [facility] Entrance of [facility], [address], [place] [address] at 9:30 (This is 1 hour(s) prior to your procedure time).  Proceed to the Check-In Desk directly inside the entrance.  Procedure Parking: Use the entrance off of the [facility] [address] [address] side of the hospital. Turn right upon entering and follow the driveway to parking that is directly in front of the [facility] Center. There is no valet parking available at this entrance, however there is an awning directly in front of the [facility] Center for drop off/ pick up for patients.  Special note: Every effort is made to have your procedure done on time. Please understand that emergencies sometimes delay scheduled procedures.  2. Diet: Nothing to eat after midnight.   3. Hydration: You need to be well hydrated before your procedure. On February 2, you may drink approved liquids (see below) until 2 hours before the procedure, with 16 oz of water as your last intake.   List of approved liquids water, clear juice, clear tea, black coffee, fruit juices, non-citric and without pulp, carbonated beverages, Gatorade, Kool -Aid, plain Jello-O and plain ice popsicles.  4. Labs: Completed on 01/02/2025  5. Medication instructions in preparation for your procedure: Nothing to hold   On the morning of your procedure, take your Aspirin 81 mg and any morning medicines NOT listed above.  You may use sips of water.  6. Plan to go home the same day, you will  only stay overnight if medically necessary. 7. Bring a current list of your medications and current insurance cards. 8. You MUST have a responsible person to drive you home. 9. Someone MUST be with you the first 24 hours after you arrive home or your discharge will be delayed. 10. Please wear clothes that are easy to get on and off and wear slip-on shoes.  Thank you for allowing us  to care for you!   --  [facility]

## 2025-01-10 NOTE — Progress Notes (Unsigned)
 "    Cardiology Office Note   Date:  01/10/2025   ID:  Briana, [name] 01/11/1946, MRN [id number]  PCP:  Leonel Cole, MD  Cardiologist:   Deatrice Cage, MD   Chief Complaint  Patient presents with   Follow-up    Cardiac cath Pt has upcoming nerve burn this Friday and wants to discuss if she is cleared to have procedure and discuss if ok to take Valium  for this procedure. Meds reviewed verbally with pt.      History of Present Illness: Briana [name] is a 79 y.o. female who is here today for a follow-up visit regarding recently diagnosed systolic heart failure and nonsustained ventricular tachycardia.    She has past medical history of Parkinson's, essential hypertension, hyperlipidemia, diet-controlled diabetes, cervical myelopathy, asthma and abnormal CT lungs.  She is suspected of having ILD. She does have moderate aortic and coronary calcifications.  Her TSH was normal.  She was seen recently for progressive dyspnea with minimal exertion that started last year and has been gradually worsening.  This has been associated with palpitations and tachycardia.  She was found to have sinus tachycardia recently.  During my initial evaluation, started her on small dose diltiazem .  An echocardiogram was done recently and showed an EF of 30 to 35% with global hypokinesis with mild to moderate mitral regurgitation. Outpatient monitor showed 2 runs of ventricular tachycardia the longest lasted 11 beats with very frequent short runs of SVT. She continues to struggle with dyspnea and palpitations but no chest pain.    Past Medical History:  Diagnosis Date   Cancer (HCC)    Basal Cell Carcinoma   Complication of anesthesia    Depression    Diabetes mellitus without complication (HCC)    Dyspnea    Pt sees pulm-Dr. Jude   Fatty liver    High cholesterol    Hypertension    Parkinson disease (HCC)    PONV (postoperative nausea and vomiting)    After a colonoscopy procedure x1     Past Surgical History:  Procedure Laterality Date   ANTERIOR CERVICAL DECOMP/DISCECTOMY FUSION N/A 12/16/2021   Procedure: Cervical Four-Five; Cervical Five-Six; Cervical Six-Seven  Anterior Cervical Decompression Fusion;  Surgeon: Cheryle Debby LABOR, MD;  Location: [facility] OR;  Service: Neurosurgery;  Laterality: N/A;  Cervical Four-Five; Cervical Five-Six; Cervical Six-Seven  Anterior Cervical Decompression Fusion   CATARACT EXTRACTION, BILATERAL     COLONOSCOPY     shoulder replacement Right 2023     Current Outpatient Medications  Medication Sig Dispense Refill   albuterol  (VENTOLIN  HFA) 108 (90 Base) MCG/ACT inhaler Inhale 1-2 puffs into the lungs every 6 (six) hours as needed. 6.7 g 2   atorvastatin  (LIPITOR) 80 MG tablet Take 1 tablet (80 mg total) by mouth daily. 90 tablet 1   bisoprolol  (ZEBETA ) 5 MG tablet Take 1 tablet (5 mg total) by mouth daily. 90 tablet 3   buPROPion  (WELLBUTRIN  XL) 150 MG 24 hr tablet Take 1 tablet (150 mg total) by mouth in the morning. 90 tablet 1   carbidopa -levodopa  (SINEMET  IR) 25-100 MG tablet Take 2 tablets by mouth at 8:30 am, 2 tablets at 12:30pm, and 1 tablet at 4:30 pm 540 tablet 2   pantoprazole  (PROTONIX ) 40 MG tablet Take 1 tablet (40 mg total) by mouth 2 (two) times daily. 200 tablet 3   predniSONE  (STERAPRED UNI-PAK 21 TAB) 5 MG (21) TBPK tablet Take as directed in package for 6 days. 21 tablet 0  sertraline  (ZOLOFT ) 100 MG tablet Take 1.5 tablets (150 mg total) by mouth daily. 135 tablet 1   valsartan  (DIOVAN ) 160 MG tablet Take 1 tablet (160 mg total) by mouth daily. 90 tablet 3   fluticasone  furoate-vilanterol (BREO ELLIPTA ) 100-25 MCG/ACT AEPB Inhale 1 puff into the lungs daily. (Patient not taking: Reported on 01/10/2025) 60 each 5   No current facility-administered medications for this visit.    Allergies:   Erythromycin, Erythromycin base, Nitrofurantoin, and Nitrofurantoin macrocrystal    Social History:  The patient  reports  that she has quit smoking. She has never used smokeless tobacco. She reports that she does not drink alcohol and does not use drugs.   Family History:  The patient's family history includes Cancer in her mother; Diabetes in her child and maternal grandmother; Heart Problems in her mother; Pancreatic cancer in her father.    ROS:  Please see the history of present illness.   Otherwise, review of systems are positive for none.   All other systems are reviewed and negative.    PHYSICAL EXAM: VS:  BP 108/70 (BP Location: Left Arm, Patient Position: Sitting, Cuff Size: Large)   Pulse 90   Ht 4' 11 (1.499 m)   Wt 165 lb 6 oz (75 kg)   SpO2 96%   BMI 33.40 kg/m  , BMI Body mass index is 33.4 kg/m. GEN: Well nourished, well developed, in no acute distress  HEENT: normal  Neck: no JVD, carotid bruits, or masses Cardiac: RRR and mildly tachycardic; no murmurs, rubs, or gallops,no edema  Respiratory:  clear to auscultation bilaterally, normal work of breathing GI: soft, nontender, nondistended, + BS MS: no deformity or atrophy  Skin: warm and dry, no rash Neuro:  Strength and sensation are intact Psych: euthymic mood, full affect   EKG:  EKG is ordered today. The ekg ordered today demonstrates : Normal sinus rhythm Left axis deviation Left ventricular hypertrophy with QRS widening ( R in aVL , Cornell product ) Nonspecific T wave abnormality When compared with ECG of 02-Jan-2025 10:46, (RBBB and left anterior fascicular block) is no longer Present Criteria for Septal infarct are no longer Present       Recent Labs: 11/13/2024: ALT 13; Pro B Natriuretic peptide (BNP) 74.0; TSH 1.27 01/02/2025: BNP 37.7; BUN 22; Creatinine, Ser 0.92; Hemoglobin 13.2; Magnesium 1.9; Platelets 200; Potassium 4.3; Sodium 143    Lipid Panel No results found for: CHOL, TRIG, HDL, CHOLHDL, VLDL, LDLCALC, LDLDIRECT    Wt Readings from Last 3 Encounters:  01/10/25 165 lb 6 oz (75 kg)   01/08/25 165 lb (74.8 kg)  01/02/25 164 lb 9.6 oz (74.7 kg)          11/20/2024    6:53 PM  PAD Screen  Previous PAD dx? No   Previous surgical procedure? No   Pain with walking? No   Feet/toe relief with dangling? Yes   Painful, non-healing ulcers? No   Extremities discolored? No      Manually entered by patient      ASSESSMENT AND PLAN:  1.  Chronic systolic heart failure: Recent echocardiogram showed moderately reduced LV systolic function which is the likely culprit for her progressive shortness of breath.  Previous imaging showed moderate coronary artery calcifications.  We have to determine the etiology for her cardiomyopathy especially in the setting of recent nonsustained ventricular tachycardia on outpatient monitor. I recommend proceeding with a right and left cardiac catheterization and possible PCI.  Planned access is  via the right arm.  Informed Consent   Shared Decision Making/Informed Consent{ All outpatient stress tests require an informed consent ([id number]) ATTESTATION ORDER       :[id number]} The risks [stroke (1 in 1000), death (1 in 1000), kidney failure [usually temporary] (1 in 500), bleeding (1 in 200), allergic reaction [possibly serious] (1 in 200)], benefits (diagnostic support and management of coronary artery disease) and alternatives of a cardiac catheterization were discussed in detail with Ms. Cowman and she is willing to proceed.     Continue bisoprolol  and valsartan  for now and will consider further uptitration of heart failure medications after cardiac cath.  2.  Palpitations and tachycardia: Outpatient monitor showed 2 runs of nonsustained ventricular tachycardia and very frequent runs of SVT.  She was recently started on bisoprolol  and seems to be tolerating the medication.    3.  Essential hypertension: Blood pressure is controlled on current medications.  4.  Hyperlipidemia: Currently on atorvastatin  80 mg daily.  Recommended target LDL of  less than 70.  5.  Chronic low back pain: She reports that she is scheduled for some form of back injection on Friday with moderate sedation.  No contraindication from a cardiac standpoint.   Disposition:   FU 2 weeks after cardiac catheterization.  Signed,  Deatrice Cage, MD  01/10/2025 11:17 AM    [facility] "

## 2025-01-10 NOTE — H&P (View-Only) (Signed)
 "    Cardiology Office Note   Date:  01/10/2025   ID:  [name], [name] 01/11/1946, MRN [id number]  PCP:  Leonel Cole, MD  Cardiologist:   Deatrice Cage, MD   Chief Complaint  Patient presents with   Follow-up    Cardiac cath Pt has upcoming nerve burn this Friday and wants to discuss if she is cleared to have procedure and discuss if ok to take Valium  for this procedure. Meds reviewed verbally with pt.      History of Present Illness: YARNELL KOZLOSKI is a 79 y.o. female who is here today for a follow-up visit regarding recently diagnosed systolic heart failure and nonsustained ventricular tachycardia.    She has past medical history of Parkinson's, essential hypertension, hyperlipidemia, diet-controlled diabetes, cervical myelopathy, asthma and abnormal CT lungs.  She is suspected of having ILD. She does have moderate aortic and coronary calcifications.  Her TSH was normal.  She was seen recently for progressive dyspnea with minimal exertion that started last year and has been gradually worsening.  This has been associated with palpitations and tachycardia.  She was found to have sinus tachycardia recently.  During my initial evaluation, started her on small dose diltiazem .  An echocardiogram was done recently and showed an EF of 30 to 35% with global hypokinesis with mild to moderate mitral regurgitation. Outpatient monitor showed 2 runs of ventricular tachycardia the longest lasted 11 beats with very frequent short runs of SVT. She continues to struggle with dyspnea and palpitations but no chest pain.    Past Medical History:  Diagnosis Date   Cancer (HCC)    Basal Cell Carcinoma   Complication of anesthesia    Depression    Diabetes mellitus without complication (HCC)    Dyspnea    Pt sees pulm-Dr. Jude   Fatty liver    High cholesterol    Hypertension    Parkinson disease (HCC)    PONV (postoperative nausea and vomiting)    After a colonoscopy procedure x1     Past Surgical History:  Procedure Laterality Date   ANTERIOR CERVICAL DECOMP/DISCECTOMY FUSION N/A 12/16/2021   Procedure: Cervical Four-Five; Cervical Five-Six; Cervical Six-Seven  Anterior Cervical Decompression Fusion;  Surgeon: Cheryle Debby LABOR, MD;  Location: [facility] OR;  Service: Neurosurgery;  Laterality: N/A;  Cervical Four-Five; Cervical Five-Six; Cervical Six-Seven  Anterior Cervical Decompression Fusion   CATARACT EXTRACTION, BILATERAL     COLONOSCOPY     shoulder replacement Right 2023     Current Outpatient Medications  Medication Sig Dispense Refill   albuterol  (VENTOLIN  HFA) 108 (90 Base) MCG/ACT inhaler Inhale 1-2 puffs into the lungs every 6 (six) hours as needed. 6.7 g 2   atorvastatin  (LIPITOR) 80 MG tablet Take 1 tablet (80 mg total) by mouth daily. 90 tablet 1   bisoprolol  (ZEBETA ) 5 MG tablet Take 1 tablet (5 mg total) by mouth daily. 90 tablet 3   buPROPion  (WELLBUTRIN  XL) 150 MG 24 hr tablet Take 1 tablet (150 mg total) by mouth in the morning. 90 tablet 1   carbidopa -levodopa  (SINEMET  IR) 25-100 MG tablet Take 2 tablets by mouth at 8:30 am, 2 tablets at 12:30pm, and 1 tablet at 4:30 pm 540 tablet 2   pantoprazole  (PROTONIX ) 40 MG tablet Take 1 tablet (40 mg total) by mouth 2 (two) times daily. 200 tablet 3   predniSONE  (STERAPRED UNI-PAK 21 TAB) 5 MG (21) TBPK tablet Take as directed in package for 6 days. 21 tablet 0  sertraline  (ZOLOFT ) 100 MG tablet Take 1.5 tablets (150 mg total) by mouth daily. 135 tablet 1   valsartan  (DIOVAN ) 160 MG tablet Take 1 tablet (160 mg total) by mouth daily. 90 tablet 3   fluticasone  furoate-vilanterol (BREO ELLIPTA ) 100-25 MCG/ACT AEPB Inhale 1 puff into the lungs daily. (Patient not taking: Reported on 01/10/2025) 60 each 5   No current facility-administered medications for this visit.    Allergies:   Erythromycin, Erythromycin base, Nitrofurantoin, and Nitrofurantoin macrocrystal    Social History:  The patient  reports  that she has quit smoking. She has never used smokeless tobacco. She reports that she does not drink alcohol and does not use drugs.   Family History:  The patient's family history includes Cancer in her mother; Diabetes in her child and maternal grandmother; Heart Problems in her mother; Pancreatic cancer in her father.    ROS:  Please see the history of present illness.   Otherwise, review of systems are positive for none.   All other systems are reviewed and negative.    PHYSICAL EXAM: VS:  BP 108/70 (BP Location: Left Arm, Patient Position: Sitting, Cuff Size: Large)   Pulse 90   Ht 4' 11 (1.499 m)   Wt 165 lb 6 oz (75 kg)   SpO2 96%   BMI 33.40 kg/m  , BMI Body mass index is 33.4 kg/m. GEN: Well nourished, well developed, in no acute distress  HEENT: normal  Neck: no JVD, carotid bruits, or masses Cardiac: RRR and mildly tachycardic; no murmurs, rubs, or gallops,no edema  Respiratory:  clear to auscultation bilaterally, normal work of breathing GI: soft, nontender, nondistended, + BS MS: no deformity or atrophy  Skin: warm and dry, no rash Neuro:  Strength and sensation are intact Psych: euthymic mood, full affect   EKG:  EKG is ordered today. The ekg ordered today demonstrates : Normal sinus rhythm Left axis deviation Left ventricular hypertrophy with QRS widening ( R in aVL , Cornell product ) Nonspecific T wave abnormality When compared with ECG of 02-Jan-2025 10:46, (RBBB and left anterior fascicular block) is no longer Present Criteria for Septal infarct are no longer Present       Recent Labs: 11/13/2024: ALT 13; Pro B Natriuretic peptide (BNP) 74.0; TSH 1.27 01/02/2025: BNP 37.7; BUN 22; Creatinine, Ser 0.92; Hemoglobin 13.2; Magnesium 1.9; Platelets 200; Potassium 4.3; Sodium 143    Lipid Panel No results found for: CHOL, TRIG, HDL, CHOLHDL, VLDL, LDLCALC, LDLDIRECT    Wt Readings from Last 3 Encounters:  01/10/25 165 lb 6 oz (75 kg)   01/08/25 165 lb (74.8 kg)  01/02/25 164 lb 9.6 oz (74.7 kg)          11/20/2024    6:53 PM  PAD Screen  Previous PAD dx? No   Previous surgical procedure? No   Pain with walking? No   Feet/toe relief with dangling? Yes   Painful, non-healing ulcers? No   Extremities discolored? No      Manually entered by patient      ASSESSMENT AND PLAN:  1.  Chronic systolic heart failure: Recent echocardiogram showed moderately reduced LV systolic function which is the likely culprit for her progressive shortness of breath.  Previous imaging showed moderate coronary artery calcifications.  We have to determine the etiology for her cardiomyopathy especially in the setting of recent nonsustained ventricular tachycardia on outpatient monitor. I recommend proceeding with a right and left cardiac catheterization and possible PCI.  Planned access is  via the right arm.  Informed Consent   Shared Decision Making/Informed Consent{ All outpatient stress tests require an informed consent ([id number]) ATTESTATION ORDER       :[id number]} The risks [stroke (1 in 1000), death (1 in 1000), kidney failure [usually temporary] (1 in 500), bleeding (1 in 200), allergic reaction [possibly serious] (1 in 200)], benefits (diagnostic support and management of coronary artery disease) and alternatives of a cardiac catheterization were discussed in detail with Ms. Cowman and she is willing to proceed.     Continue bisoprolol  and valsartan  for now and will consider further uptitration of heart failure medications after cardiac cath.  2.  Palpitations and tachycardia: Outpatient monitor showed 2 runs of nonsustained ventricular tachycardia and very frequent runs of SVT.  She was recently started on bisoprolol  and seems to be tolerating the medication.    3.  Essential hypertension: Blood pressure is controlled on current medications.  4.  Hyperlipidemia: Currently on atorvastatin  80 mg daily.  Recommended target LDL of  less than 70.  5.  Chronic low back pain: She reports that she is scheduled for some form of back injection on Friday with moderate sedation.  No contraindication from a cardiac standpoint.   Disposition:   FU 2 weeks after cardiac catheterization.  Signed,  Deatrice Cage, MD  01/10/2025 11:17 AM    [facility] "

## 2025-01-11 ENCOUNTER — Other Ambulatory Visit: Payer: Self-pay

## 2025-01-11 ENCOUNTER — Other Ambulatory Visit (HOSPITAL_COMMUNITY): Payer: Self-pay

## 2025-01-11 MED ORDER — PANTOPRAZOLE SODIUM 40 MG PO TBEC
40.0000 mg | DELAYED_RELEASE_TABLET | Freq: Two times a day (BID) | ORAL | 3 refills | Status: AC
  Filled 2025-01-11: qty 180, 90d supply, fill #0

## 2025-01-12 ENCOUNTER — Other Ambulatory Visit (HOSPITAL_COMMUNITY): Payer: Self-pay

## 2025-01-12 ENCOUNTER — Ambulatory Visit: Admitting: Physician Assistant

## 2025-01-12 MED ORDER — TRAMADOL HCL 50 MG PO TABS
50.0000 mg | ORAL_TABLET | Freq: Three times a day (TID) | ORAL | 0 refills | Status: AC | PRN
  Filled 2025-01-12: qty 21, 7d supply, fill #0

## 2025-01-18 ENCOUNTER — Telehealth: Payer: Self-pay | Admitting: Cardiovascular Disease

## 2025-01-18 NOTE — Telephone Encounter (Signed)
 Pt states she is returning a call.. not sure who called and/or if its about her cath. Please advise.

## 2025-01-18 NOTE — Telephone Encounter (Signed)
 Called patient - saw no other note for her being called - advised that Dr. Darron would be here on Monday for her procedure - advised to call if she feels driving in is too dangerous and her procedure can be rescheduled -  patient verbalized understanding and states she'll be here Monday

## 2025-01-22 ENCOUNTER — Other Ambulatory Visit: Payer: Self-pay

## 2025-01-22 ENCOUNTER — Ambulatory Visit
Admission: RE | Admit: 2025-01-22 | Discharge: 2025-01-22 | Disposition: A | Discharge status: Home / Self Care | Attending: Cardiovascular Disease | Admitting: Cardiovascular Disease

## 2025-01-22 ENCOUNTER — Encounter: Payer: Self-pay | Admitting: Cardiovascular Disease

## 2025-01-22 ENCOUNTER — Encounter
Admission: RE | Disposition: A | Discharge status: Home / Self Care | Payer: Self-pay | Source: Home / Self Care | Attending: Cardiovascular Disease

## 2025-01-22 DIAGNOSIS — E119 Type 2 diabetes mellitus without complications: Secondary | ICD-10-CM | POA: Insufficient documentation

## 2025-01-22 DIAGNOSIS — G8929 Other chronic pain: Secondary | ICD-10-CM | POA: Insufficient documentation

## 2025-01-22 DIAGNOSIS — E785 Hyperlipidemia, unspecified: Secondary | ICD-10-CM | POA: Insufficient documentation

## 2025-01-22 DIAGNOSIS — I4729 Other ventricular tachycardia: Secondary | ICD-10-CM

## 2025-01-22 DIAGNOSIS — G20A1 Parkinson's disease without dyskinesia, without mention of fluctuations: Secondary | ICD-10-CM | POA: Insufficient documentation

## 2025-01-22 DIAGNOSIS — I5022 Chronic systolic (congestive) heart failure: Secondary | ICD-10-CM

## 2025-01-22 DIAGNOSIS — M545 Low back pain, unspecified: Secondary | ICD-10-CM | POA: Insufficient documentation

## 2025-01-22 DIAGNOSIS — I472 Ventricular tachycardia, unspecified: Secondary | ICD-10-CM | POA: Insufficient documentation

## 2025-01-22 DIAGNOSIS — Z79899 Other long term (current) drug therapy: Secondary | ICD-10-CM | POA: Insufficient documentation

## 2025-01-22 DIAGNOSIS — Z87891 Personal history of nicotine dependence: Secondary | ICD-10-CM | POA: Insufficient documentation

## 2025-01-22 DIAGNOSIS — J45909 Unspecified asthma, uncomplicated: Secondary | ICD-10-CM | POA: Insufficient documentation

## 2025-01-22 DIAGNOSIS — I110 Hypertensive heart disease with heart failure: Secondary | ICD-10-CM | POA: Insufficient documentation

## 2025-01-22 DIAGNOSIS — I272 Pulmonary hypertension, unspecified: Secondary | ICD-10-CM | POA: Insufficient documentation

## 2025-01-22 DIAGNOSIS — I2510 Atherosclerotic heart disease of native coronary artery without angina pectoris: Secondary | ICD-10-CM | POA: Insufficient documentation

## 2025-01-22 LAB — POCT I-STAT 7, (LYTES, BLD GAS, ICA,H+H)
Acid-Base Excess: 0 mmol/L (ref 0.0–2.0)
Bicarbonate: 25.7 mmol/L (ref 20.0–28.0)
Calcium, Ion: 1.27 mmol/L (ref 1.15–1.40)
HCT: 35 % — ABNORMAL LOW (ref 36.0–46.0)
Hemoglobin: 11.9 g/dL — ABNORMAL LOW (ref 12.0–15.0)
O2 Saturation: 97 %
Potassium: 3.7 mmol/L (ref 3.5–5.1)
Sodium: 140 mmol/L (ref 135–145)
TCO2: 27 mmol/L (ref 22–32)
pCO2 arterial: 43 mmHg (ref 32–48)
pH, Arterial: 7.384 (ref 7.35–7.45)
pO2, Arterial: 93 mmHg (ref 83–108)

## 2025-01-22 MED ORDER — SODIUM CHLORIDE 0.9% FLUSH: 3.0000 mL | Freq: Two times a day (BID) | INTRAVENOUS | Status: DC

## 2025-01-22 MED ORDER — SODIUM CHLORIDE 0.9 % IV SOLN: 250.0000 mL | INTRAVENOUS | Status: DC | PRN

## 2025-01-22 MED ORDER — HEPARIN SODIUM (PORCINE) 1000 UNIT/ML IJ SOLN
INTRAMUSCULAR | Status: AC
  Filled 2025-01-22: qty 10

## 2025-01-22 MED ORDER — HEPARIN (PORCINE) IN NACL 1000-0.9 UT/500ML-% IV SOLN
INTRAVENOUS | Status: DC | PRN
  Administered 2025-01-22: 1000 mL

## 2025-01-22 MED ORDER — ASPIRIN 81 MG PO CHEW: 81.0000 mg | CHEWABLE_TABLET | ORAL | Status: DC

## 2025-01-22 MED ORDER — HEPARIN SODIUM (PORCINE) 1000 UNIT/ML IJ SOLN
INTRAMUSCULAR | Status: DC | PRN
  Administered 2025-01-22: 3500 [IU] via INTRAVENOUS

## 2025-01-22 MED ORDER — FREE WATER: 500.0000 mL | Freq: Once | Status: DC

## 2025-01-22 MED ORDER — VERAPAMIL HCL 2.5 MG/ML IV SOLN
INTRAVENOUS | Status: AC
  Filled 2025-01-22: qty 2

## 2025-01-22 MED ORDER — IOHEXOL 300 MG/ML  SOLN
INTRAMUSCULAR | Status: DC | PRN
  Administered 2025-01-22: 44 mL

## 2025-01-22 MED ORDER — LIDOCAINE HCL (PF) 1 % IJ SOLN
INTRAMUSCULAR | Status: DC | PRN
  Administered 2025-01-22 (×2): 2 mL

## 2025-01-22 MED ORDER — HEPARIN (PORCINE) IN NACL 1000-0.9 UT/500ML-% IV SOLN
INTRAVENOUS | Status: AC
  Filled 2025-01-22: qty 1000

## 2025-01-22 MED ORDER — ONDANSETRON HCL 4 MG/2ML IJ SOLN: 4.0000 mg | Freq: Four times a day (QID) | INTRAMUSCULAR | Status: DC | PRN

## 2025-01-22 MED ORDER — LIDOCAINE HCL 1 % IJ SOLN
INTRAMUSCULAR | Status: AC
  Filled 2025-01-22: qty 20

## 2025-01-22 MED ORDER — MIDAZOLAM HCL (PF) 2 MG/2ML IJ SOLN
INTRAMUSCULAR | Status: DC | PRN
  Administered 2025-01-22: 1 mg via INTRAVENOUS

## 2025-01-22 MED ORDER — VERAPAMIL HCL 2.5 MG/ML IV SOLN
INTRAVENOUS | Status: DC | PRN
  Administered 2025-01-22: 2.5 mg via INTRA_ARTERIAL

## 2025-01-22 MED ORDER — SODIUM CHLORIDE 0.9% FLUSH: 3.0000 mL | INTRAVENOUS | Status: DC | PRN

## 2025-01-22 MED ORDER — FREE WATER: 250.0000 mL | Freq: Once | Status: DC

## 2025-01-22 MED ORDER — MIDAZOLAM HCL 2 MG/2ML IJ SOLN
INTRAMUSCULAR | Status: AC
  Filled 2025-01-22: qty 2

## 2025-01-22 MED ORDER — ACETAMINOPHEN 325 MG PO TABS: 650.0000 mg | ORAL_TABLET | ORAL | Status: DC | PRN

## 2025-01-22 MED ORDER — FENTANYL CITRATE (PF) 100 MCG/2ML IJ SOLN
INTRAMUSCULAR | Status: DC | PRN
  Administered 2025-01-22: 25 ug via INTRAVENOUS

## 2025-01-22 MED ORDER — FENTANYL CITRATE (PF) 100 MCG/2ML IJ SOLN
INTRAMUSCULAR | Status: AC
  Filled 2025-01-22: qty 2

## 2025-01-22 NOTE — Interval H&P Note (Signed)
 History and Physical Interval Note:  01/22/2025 11:33 AM  Briana [name]  has presented today for surgery, with the diagnosis of R and L Cath   Chronic systolic HF.  The various methods of treatment have been discussed with the patient and family. After consideration of risks, benefits and other options for treatment, the patient has consented to  Procedures: RIGHT/LEFT HEART CATH AND CORONARY ANGIOGRAPHY (Bilateral) as a surgical intervention.  The patient's history has been reviewed, patient examined, no change in status, stable for surgery.  I have reviewed the patient's chart and labs.  Questions were answered to the patient's satisfaction.     [name]

## 2025-01-23 ENCOUNTER — Other Ambulatory Visit: Payer: Self-pay

## 2025-01-23 ENCOUNTER — Other Ambulatory Visit (HOSPITAL_COMMUNITY): Payer: Self-pay

## 2025-01-23 LAB — POCT I-STAT EG7
Acid-Base Excess: 5 mmol/L — ABNORMAL HIGH (ref 0.0–2.0)
Bicarbonate: 30.5 mmol/L — ABNORMAL HIGH (ref 20.0–28.0)
Calcium, Ion: 1.27 mmol/L (ref 1.15–1.40)
HCT: 35 % — ABNORMAL LOW (ref 36.0–46.0)
Hemoglobin: 11.9 g/dL — ABNORMAL LOW (ref 12.0–15.0)
O2 Saturation: 69 %
Potassium: 3.9 mmol/L (ref 3.5–5.1)
Sodium: 140 mmol/L (ref 135–145)
TCO2: 32 mmol/L (ref 22–32)
pCO2, Ven: 50.6 mmHg (ref 44–60)
pH, Ven: 7.389 (ref 7.25–7.43)
pO2, Ven: 37 mmHg (ref 32–45)

## 2025-01-23 MED ORDER — ATORVASTATIN CALCIUM 80 MG PO TABS
80.0000 mg | ORAL_TABLET | Freq: Every day | ORAL | 1 refills | Status: AC
  Filled 2025-01-23: qty 90, 90d supply, fill #0

## 2025-01-24 ENCOUNTER — Other Ambulatory Visit: Payer: Self-pay

## 2025-02-01 ENCOUNTER — Ambulatory Visit

## 2025-02-02 ENCOUNTER — Ambulatory Visit: Admitting: Physician Assistant

## 2025-05-31 ENCOUNTER — Ambulatory Visit: Admitting: Neurology
